# Patient Record
Sex: Male | Born: 1945 | Race: Black or African American | Hispanic: No | Smoking: Never smoker
Health system: Southern US, Community
[De-identification: ages and names within clinical notes are randomized; demographics above are authoritative.]

## PROBLEM LIST (undated history)

## (undated) ENCOUNTER — Ambulatory Visit (HOSPITAL_COMMUNITY): Admission: EM | Payer: Medicare Other | Source: Home / Self Care

## (undated) DIAGNOSIS — I10 Essential (primary) hypertension: Secondary | ICD-10-CM

## (undated) DIAGNOSIS — E785 Hyperlipidemia, unspecified: Secondary | ICD-10-CM

## (undated) DIAGNOSIS — E119 Type 2 diabetes mellitus without complications: Secondary | ICD-10-CM

## (undated) DIAGNOSIS — H548 Legal blindness, as defined in USA: Secondary | ICD-10-CM

## (undated) DIAGNOSIS — H409 Unspecified glaucoma: Secondary | ICD-10-CM

## (undated) DIAGNOSIS — R55 Syncope and collapse: Secondary | ICD-10-CM

## (undated) HISTORY — PX: COLONOSCOPY: SHX5424

## (undated) HISTORY — PX: FRACTURE SURGERY: SHX138

## (undated) HISTORY — PX: ANKLE FRACTURE SURGERY: SHX122

## (undated) HISTORY — PX: EYE SURGERY: SHX253

---

## 2004-10-12 ENCOUNTER — Ambulatory Visit (HOSPITAL_COMMUNITY): Admission: RE | Admit: 2004-10-12 | Discharge: 2004-10-12 | Payer: Self-pay | Admitting: Ophthalmology

## 2004-12-18 ENCOUNTER — Ambulatory Visit (HOSPITAL_COMMUNITY): Admission: RE | Admit: 2004-12-18 | Discharge: 2004-12-18 | Payer: Self-pay | Admitting: Ophthalmology

## 2005-01-11 ENCOUNTER — Emergency Department (HOSPITAL_COMMUNITY): Admission: EM | Admit: 2005-01-11 | Discharge: 2005-01-11 | Payer: Self-pay | Admitting: Emergency Medicine

## 2005-01-22 ENCOUNTER — Ambulatory Visit: Payer: Self-pay | Admitting: Internal Medicine

## 2007-10-18 ENCOUNTER — Ambulatory Visit (HOSPITAL_COMMUNITY): Admission: EM | Admit: 2007-10-18 | Discharge: 2007-10-18 | Payer: Self-pay | Admitting: Emergency Medicine

## 2008-10-20 ENCOUNTER — Emergency Department (HOSPITAL_COMMUNITY): Admission: EM | Admit: 2008-10-20 | Discharge: 2008-10-20 | Payer: Self-pay | Admitting: Family Medicine

## 2008-10-20 ENCOUNTER — Emergency Department (HOSPITAL_COMMUNITY): Admission: EM | Admit: 2008-10-20 | Discharge: 2008-10-21 | Payer: Self-pay | Admitting: Emergency Medicine

## 2010-06-03 LAB — BASIC METABOLIC PANEL
BUN: 11 mg/dL (ref 6–23)
CO2: 27 mEq/L (ref 19–32)
Calcium: 8.7 mg/dL (ref 8.4–10.5)
Chloride: 103 mEq/L (ref 96–112)
Creatinine, Ser: 1.12 mg/dL (ref 0.4–1.5)
GFR calc Af Amer: >60 mL/min (ref >60)
GFR calc non Af Amer: >60 mL/min (ref >60)
Glucose, Bld: 149 mg/dL — ABNORMAL HIGH (ref 70–99)
Potassium: 3.7 mEq/L (ref 3.5–5.1)
Sodium: 135 mEq/L (ref 135–145)

## 2010-06-03 LAB — POCT I-STAT, CHEM 8
BUN: 12 mg/dL (ref 6–23)
Calcium, Ion: 1.15 mmol/L (ref 1.12–1.32)
Chloride: 104 mEq/L (ref 96–112)
Creatinine, Ser: 1 mg/dL (ref 0.4–1.5)
Glucose, Bld: 135 mg/dL — ABNORMAL HIGH (ref 70–99)
HCT: 48 % (ref 39.0–52.0)
Hemoglobin: 16.3 g/dL (ref 13.0–17.0)
Potassium: 3.8 mEq/L (ref 3.5–5.1)
Sodium: 136 mEq/L (ref 135–145)
TCO2: 23 mmol/L (ref 0–100)

## 2010-06-03 LAB — URINALYSIS, ROUTINE W REFLEX MICROSCOPIC
Bilirubin Urine: NEGATIVE
Glucose, UA: NEGATIVE mg/dL
Ketones, ur: NEGATIVE mg/dL
Leukocytes, UA: NEGATIVE
Nitrite: NEGATIVE
Protein, ur: NEGATIVE mg/dL
Specific Gravity, Urine: 1.01 (ref 1.005–1.030)
Urobilinogen, UA: 0.2 mg/dL (ref 0.0–1.0)
pH: 5 (ref 5.0–8.0)

## 2010-06-03 LAB — URINE MICROSCOPIC-ADD ON

## 2010-06-03 LAB — DIFFERENTIAL
Basophils Absolute: 0.1 10*3/uL (ref 0.0–0.1)
Basophils Relative: 1 % (ref 0–1)
Eosinophils Absolute: 0 10*3/uL (ref 0.0–0.7)
Eosinophils Relative: 0 % (ref 0–5)
Lymphocytes Relative: 18 % (ref 12–46)
Lymphs Abs: 1.4 10*3/uL (ref 0.7–4.0)
Monocytes Absolute: 0.6 10*3/uL (ref 0.1–1.0)
Monocytes Relative: 8 % (ref 3–12)
Neutro Abs: 5.6 10*3/uL (ref 1.7–7.7)
Neutrophils Relative %: 74 % (ref 43–77)

## 2010-06-03 LAB — CBC
HCT: 43.3 % (ref 39.0–52.0)
Hemoglobin: 15.1 g/dL (ref 13.0–17.0)
MCHC: 34.8 g/dL (ref 30.0–36.0)
MCV: 90.3 fL (ref 78.0–100.0)
Platelets: 223 10*3/uL (ref 150–400)
RBC: 4.8 MIL/uL (ref 4.22–5.81)
RDW: 13.8 % (ref 11.5–15.5)
WBC: 7.7 10*3/uL (ref 4.0–10.5)
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
## 2010-07-14 NOTE — Op Note (Signed)
[name] [name], APPLEGATE                  ACCOUNT NO.:  [id number]   MEDICAL RECORD NO.:  [id number]          PATIENT TYPE:  AMB   LOCATION:  SDS                          FACILITY:  [facility]   PHYSICIAN:  Salley Scarlet., M.D.DATE OF BIRTH:  [birth year]   DATE OF PROCEDURE:  DATE OF DISCHARGE:  12/18/2004                                 OPERATIVE REPORT   PREOPERATIVE DIAGNOSIS:  Advanced glaucoma, poor control, right eye.   POSTOPERATIVE DIAGNOSIS:  Advanced glaucoma, poor control, right eye.   OPERATION/PROCEDURE:  Trabeculectomy, right eye, with mitomycin C.   ANESTHESIA:  Local using Xylocaine 2%, Marcaine 0.75% and Wydase.   JUSTIFICATION FOR PROCEDURE:  This is a 65 year old gentleman who has  history of advanced glaucoma, poorly controlled in both eyes.  He recently  underwent a trabeculectomy of the left eye with good surgical results, and  he is now admitted for trabeculectomy of the right eye.  He has intraocular  pressure of 26 on the right from the maximum medical therapy; 16 on the left  followed his glaucoma surgery.  Visual acuity 20/400 on the right and 20/60  on the left with markedly constricted visual fields.   DESCRIPTION OF PROCEDURE:  Under the influence of IV sedation, Van Lint  akinesia and retrobulbar anesthesia were given.  The patient was prepped and  draped in the usual manner.  The lid speculum was inserted under the upper  and lower lids of the right eye and a 4-0 silk traction suture was fastened  to the belly of the superior rectus muscle for traction.  A limbal-based  conjunctival flap was turned and hemostasis was achieved by using cautery.  A triangular scleral flap was fashioned in the superior and nasal quadrants  by using a beaver blade.  After fashioning the flap, a Weck-cel pledget  soaked in mitomycin C was allowed to sit on the sclera beneath the  conjunctiva and Tenon's capsule for approximately two minutes.  The  mitomycin C was thoroughly  irrigated from the eye.  The scleral flap was  then dissected down into clear cornea using the Crescent blade.  A side port  incision made at the 1:30 o'clock position.  Ocucoat was injected into the  eye through the side port incision.  Then beneath the triangular scleral  flap, a rectangular flap of tissue being approximately 2 x 3 mm in  dimensions was excised, thereby entering the anterior chamber and removing a  portion of the trabeculum.  A peripheral iridectomy was made in the eye.  Anterior chamber was reformed and the pupils constricted using Miochol.  Ocucoat was injected into the eye.  Two apices of the triangular scleral  flap was tacked down using 10-0 nylon.  The conjunctiva and Tenon's capsule  were closed by using a running suture of 8-0 Vicryl.  After that, seeing  that the wound was adequately closed, 1 mL of Celestone and 0.5 mL  gentamicin were injected subconjunctivally.  Maxitrol ophthalmic ointment  and Atropine drops were applied along with a patch and Fox shield.  The  patient tolerated  the procedure well and was discharged to the post  anesthesia care unit room in satisfactory condition.   He was instructed to rest today, to take Vicodin every four hours as needed  for pain and see me in the office tomorrow for further evaluation.   DISCHARGE DIAGNOSIS:  Advanced glaucoma, poorly controlled, right eye.      Salley Scarlet., M.D.  Electronically Signed     TB/MEDQ  D:  01/01/2005  T:  01/01/2005  Job:  [id number]

## 2011-12-28 ENCOUNTER — Encounter (HOSPITAL_COMMUNITY): Payer: Self-pay | Admitting: *Deleted

## 2011-12-28 ENCOUNTER — Encounter (HOSPITAL_COMMUNITY): Payer: Self-pay | Admitting: Pharmacy Technician

## 2011-12-28 ENCOUNTER — Other Ambulatory Visit: Payer: Self-pay | Admitting: Ophthalmology

## 2011-12-28 MED ORDER — TETRACAINE HCL 0.5 % OP SOLN: 1.0000 [drp] | OPHTHALMIC | Status: AC

## 2011-12-28 NOTE — Progress Notes (Signed)
Pt has medications for diabetes and high blood pressure, pt cant see to read labels.  [name] [name] , pts daughter is to call [facility] am of surgery with list of medications to see if patient should take any.

## 2011-12-28 NOTE — Progress Notes (Signed)
I notified the office that Dr Harlon Flor needs to sign orders.

## 2011-12-28 NOTE — H&P (Signed)
                  History & Physical:   DATE:     NAME:  [name] [name], [name] [name]      [id number]       HISTORY OF PRESENT ILLNESS: Chief Eye Complaints glaucoma    HPI: EYES: Reports symptoms of eyelid problems.        LOCATION:      QUALITY/COURSE:   Reports condition is worsening.        INTENSITY/SEVERITY:    Reports measurement ( or degree) as   DURATION:   Reports the general length of symptoms to be months.      ONSET/TIMING:   Reports occurrence as prolonged.      CONTEXT/WHEN:   Reports usually associated with   MODIFIERS/TREATMENTS:  Improved by              ROS:   GEN- Constitutional: HENT: GEN - Endocrine: Reports symptoms of diabetes.    LUNGS/Respiratory:  HEART/Cardiovascular: Reports symptoms of hypertension.    ABD/Gastrointestinal:   Musculoskeletal (BJE): NEURO/Neurological: PSYCH/Psychiatric:    Is the pt oriented to time, place, person? yes Mood depressed __ normal  agitated __  ACTIVE PROBLEMS: Glaucoma, severe stage   ICD#365.73  uncontrolled OS despite MTMT with failure of previous glaucoma surgery Elevated IOP OD Blindness, one eye, low vision other eye   ICD#369.10     Diabetic retinopathy, proliferative   ICD#362.02  SURGERIES: Pick List - Surgeries NONE  MEDICATIONS: Diamox Sequels: Strength-  SIG-   last used 4 days ago Prednisolone acetate inj  #[id number]  Related Dxs-  OD QD   Dorzolamide Hydrochloride: Strength-  SIG-  Dose-  Freq-       Modifiers-      Alphagan P: Strength-  SIG-    Travatan Z: Strength-  SIG-   OU QHS  No exposure to tobacco.  SOCIAL HISTORY: Single. WORKS  [employer]  FAMILY HISTORY: Positive family history for  -   Negative family history for  -   PARENTS: CHILDREN: GRANDPARENTS: SIBLINGS: UNCLES/AUNTS: OTHERS/DISTANT:  ALLERGIES: Starter - Allergies - Summary:  PHYSICAL EXAMINATION: [place]     OD:cc 20/NLP OS:cc 20/70 PH 20/NI  EYEGLASSES:  OD:-2.25+0.50x162                                               OS:-2.25+0.50x017 Single Vision  MR:Defer    PUPILS:  OD:nonreactive  OS:4mm   EYELIDS & OCULAR ADNEXA: normal Mortality: Full  SLE: Conjunctiva: +1 injection with cystic bleb OS  Cornea:   Decreased Tear Break-Up Time with Arcus OU  edema OD w/ endothelia pigment    Anterior Chamber:   deep and quiet  OU  Iris: brown OD:Brombay OS:  PERIPHERAL  IRIDECTOMY   close Lens:  OD:1+NS OS:1-2 NS  Ta   in mmHg: Last Visit OD44/44/OS 22    OD: 60            OS: 26/ 22 after DP by Patient Time:12/27/2011 17:10   Gonio:show area close  Fundus:  optic nerve:   OD: 90% cup                                                     OS:Temporal Rim loss   Macula:  Clear     OD:                                                     OS:   Vessels:  Periphery:  H&P B/P:160/88 Pulse:68 Resp:20   Exam: GENERAL: Appearance: General appearance can be described as well-nourished, well-developed, and in no acute distress.    LYMPHATIC: HEAD, EARS, NOSE AND THROAT: Ears-Nose (external) Inspection: Externally, nose and ears are normal in appearance and without scars, lesions, or nodules.      Otoscopic Exam: External auditory canals and tympanic membranes are normal.      Hearing assessment shows no problems with normal conversation.    Nose exam, internally, reveals nasal mucosa, septum and turbinates are unremarkable.    Teeth, Gingiva, and Lip Exams: No lesions or evidence of infection.      Oropharynx demonstrates oral mucosa, salivary glands, tongue, tonsils, posterior pharynx, hard-soft palates are normal.  EYES: see above  NECK: Neck tissue exam demonstrates no masses, symmetrical, and trachea is midline.      LUNGS and RESPIRATORY: Lung auscultation elicits no wheezing, rhonci, rales or rubs and with equal breath sounds.    Respiratory effort described as breathing is unlabored and chest movement is symmetrical.    HEART (Cardiovascular): Heart auscultation discovers  regular rate and rhythm; no murmur, gallop or rub. Normal heart sounds.    ABDOMEN (Gastrointestinal): Mass/Tenderness Exam: Neither are present.     Liver/Spleen: No hepatomegaly or splenomegaly.   MUSCULOSKELETAL (BJE): Inspection-Palpation: No major bone, joint, tendon, or muscle changes.      NEUROLOGICAL: Alert and oriented. No major deficits of coordination or sensation.      PSYCHIATRIC: Insight and judgment appear  both to be intact and appropriate.    Mood and affect are described as normal mood and full affect.    SKIN: Skin Inspection: No rashes or lesions.  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  Given 500 mg Diamox      ___________________________ Derric Dealmeida, Jr. Starter - Inactive Problems:  

## 2011-12-30 MED ORDER — GATIFLOXACIN 0.5 % OP SOLN
1.0000 [drp] | OPHTHALMIC | Status: AC
  Administered 2011-12-31 (×3): 1 [drp] via OPHTHALMIC

## 2011-12-31 ENCOUNTER — Encounter (HOSPITAL_COMMUNITY): Payer: Self-pay | Admitting: Surgery

## 2011-12-31 ENCOUNTER — Ambulatory Visit (HOSPITAL_COMMUNITY)
Admission: RE | Admit: 2011-12-31 | Discharge: 2011-12-31 | Disposition: A | Discharge status: Home / Self Care | Payer: Medicare Other | Source: Ambulatory Visit | Attending: Ophthalmology | Admitting: Ophthalmology

## 2011-12-31 ENCOUNTER — Encounter (HOSPITAL_COMMUNITY)
Admission: RE | Disposition: A | Discharge status: Home / Self Care | Payer: Self-pay | Source: Ambulatory Visit | Attending: Ophthalmology

## 2011-12-31 ENCOUNTER — Encounter (HOSPITAL_COMMUNITY): Payer: Self-pay | Admitting: Certified Registered Nurse Anesthetist

## 2011-12-31 ENCOUNTER — Ambulatory Visit: Admit: 2011-12-31 | Payer: Self-pay | Admitting: Ophthalmology

## 2011-12-31 ENCOUNTER — Ambulatory Visit (HOSPITAL_COMMUNITY): Payer: Medicare Other | Admitting: Certified Registered Nurse Anesthetist

## 2011-12-31 ENCOUNTER — Ambulatory Visit (HOSPITAL_COMMUNITY): Payer: Medicare Other

## 2011-12-31 DIAGNOSIS — Y849 Medical procedure, unspecified as the cause of abnormal reaction of the patient, or of later complication, without mention of misadventure at the time of the procedure: Secondary | ICD-10-CM | POA: Insufficient documentation

## 2011-12-31 DIAGNOSIS — Y9210 Unspecified residential institution as the place of occurrence of the external cause: Secondary | ICD-10-CM | POA: Insufficient documentation

## 2011-12-31 DIAGNOSIS — E119 Type 2 diabetes mellitus without complications: Secondary | ICD-10-CM | POA: Insufficient documentation

## 2011-12-31 DIAGNOSIS — IMO0002 Reserved for concepts with insufficient information to code with codable children: Secondary | ICD-10-CM | POA: Insufficient documentation

## 2011-12-31 DIAGNOSIS — I10 Essential (primary) hypertension: Secondary | ICD-10-CM | POA: Insufficient documentation

## 2011-12-31 DIAGNOSIS — H409 Unspecified glaucoma: Secondary | ICD-10-CM | POA: Insufficient documentation

## 2011-12-31 HISTORY — PX: TRABECULECTOMY: SHX107

## 2011-12-31 HISTORY — DX: Essential (primary) hypertension: I10

## 2011-12-31 LAB — BASIC METABOLIC PANEL
BUN: 19 mg/dL (ref 6–23)
CO2: 24 mEq/L (ref 19–32)
Calcium: 9.4 mg/dL (ref 8.4–10.5)
Chloride: 106 mEq/L (ref 96–112)
Creatinine, Ser: 1.09 mg/dL (ref 0.50–1.35)
GFR calc non Af Amer: 70 mL/min — ABNORMAL LOW (ref >90)
Glucose, Bld: 122 mg/dL — ABNORMAL HIGH (ref 70–99)
Potassium: 4.1 mEq/L (ref 3.5–5.1)
Sodium: 139 mEq/L (ref 135–145)

## 2011-12-31 LAB — CBC
HCT: 45.1 % (ref 39.0–52.0)
Hemoglobin: 15.7 g/dL (ref 13.0–17.0)
MCH: 30.3 pg (ref 26.0–34.0)
MCV: 86.9 fL (ref 78.0–100.0)
RBC: 5.19 MIL/uL (ref 4.22–5.81)
RDW: 13.5 % (ref 11.5–15.5)
WBC: 4.9 10*3/uL (ref 4.0–10.5)

## 2011-12-31 LAB — SURGICAL PCR SCREEN
MRSA, PCR: NEGATIVE
Staphylococcus aureus: NEGATIVE

## 2011-12-31 SURGERY — TRABECULECTOMY
Anesthesia: Monitor Anesthesia Care | Site: Eye | Laterality: Left | Wound class: Clean

## 2011-12-31 SURGERY — INSERTION OF MINI SHUNT
Anesthesia: Monitor Anesthesia Care | Laterality: Left

## 2011-12-31 MED ORDER — EPINEPHRINE HCL 1 MG/ML IJ SOLN
INTRAMUSCULAR | Status: AC
  Filled 2011-12-31: qty 1

## 2011-12-31 MED ORDER — LIDOCAINE HCL 2 % IJ SOLN
INTRAMUSCULAR | Status: DC | PRN
  Administered 2011-12-31: 10 mL

## 2011-12-31 MED ORDER — FLUOROURACIL FOR OPHTHALMIC USE
25.0000 mg | Freq: Once | INTRAVENOUS | Status: DC
  Filled 2011-12-31: qty 0.5

## 2011-12-31 MED ORDER — FLUOROURACIL FOR OPHTHALMIC USE
INTRAVENOUS | Status: DC | PRN
  Administered 2011-12-31: 5 mg via OPHTHALMIC

## 2011-12-31 MED ORDER — ATROPINE SULFATE 1 % OP OINT
TOPICAL_OINTMENT | OPHTHALMIC | Status: AC
  Filled 2011-12-31: qty 3.5

## 2011-12-31 MED ORDER — FLUORESCEIN SODIUM 1 MG OP STRP
ORAL_STRIP | OPHTHALMIC | Status: AC
  Filled 2011-12-31: qty 3

## 2011-12-31 MED ORDER — LIDOCAINE HCL 2 % IJ SOLN
INTRAMUSCULAR | Status: AC
  Filled 2011-12-31: qty 20

## 2011-12-31 MED ORDER — TOBRAMYCIN-DEXAMETHASONE 0.3-0.1 % OP OINT
TOPICAL_OINTMENT | OPHTHALMIC | Status: AC
  Filled 2011-12-31: qty 3.5

## 2011-12-31 MED ORDER — HYALURONIDASE HUMAN 150 UNIT/ML IJ SOLN
INTRAMUSCULAR | Status: AC
  Filled 2011-12-31: qty 1

## 2011-12-31 MED ORDER — BSS IO SOLN
INTRAOCULAR | Status: DC | PRN
  Administered 2011-12-31: 15 mL via INTRAOCULAR

## 2011-12-31 MED ORDER — PILOCARPINE HCL 4 % OP GEL
OPHTHALMIC | Status: AC
  Filled 2011-12-31: qty 4

## 2011-12-31 MED ORDER — MUPIROCIN 2 % EX OINT
TOPICAL_OINTMENT | CUTANEOUS | Status: AC
  Administered 2011-12-31: 1 via NASAL
  Filled 2011-12-31: qty 22

## 2011-12-31 MED ORDER — TETRACAINE HCL 0.5 % OP SOLN
OPHTHALMIC | Status: DC | PRN
  Administered 2011-12-31: 2 [drp] via OPHTHALMIC

## 2011-12-31 MED ORDER — ACETYLCHOLINE CHLORIDE 1:100 IO SOLR
INTRAOCULAR | Status: AC
  Filled 2011-12-31: qty 1

## 2011-12-31 MED ORDER — BSS IO SOLN
INTRAOCULAR | Status: AC
  Filled 2011-12-31: qty 500

## 2011-12-31 MED ORDER — LIDOCAINE-EPINEPHRINE 2 %-1:100000 IJ SOLN
INTRAMUSCULAR | Status: AC
  Filled 2011-12-31: qty 1

## 2011-12-31 MED ORDER — SODIUM CHLORIDE 0.9 % IV SOLN
INTRAVENOUS | Status: DC
  Administered 2011-12-31 (×2): via INTRAVENOUS

## 2011-12-31 MED ORDER — TRIAMCINOLONE ACETONIDE 40 MG/ML IJ SUSP
INTRAMUSCULAR | Status: AC
  Filled 2011-12-31: qty 1

## 2011-12-31 MED ORDER — SODIUM HYALURONATE 10 MG/ML IO SOLN
INTRAOCULAR | Status: AC
  Filled 2011-12-31: qty 0.85

## 2011-12-31 MED ORDER — BUPIVACAINE HCL (PF) 0.25 % IJ SOLN
INTRAMUSCULAR | Status: AC
  Filled 2011-12-31: qty 30

## 2011-12-31 MED ORDER — MUPIROCIN 2 % EX OINT
TOPICAL_OINTMENT | Freq: Two times a day (BID) | CUTANEOUS | Status: DC
  Filled 2011-12-31: qty 22

## 2011-12-31 MED ORDER — BSS IO SOLN
INTRAOCULAR | Status: AC
  Filled 2011-12-31: qty 15

## 2011-12-31 MED ORDER — TOBRAMYCIN-DEXAMETHASONE 0.3-0.1 % OP SUSP
OPHTHALMIC | Status: DC | PRN
  Administered 2011-12-31: 2 [drp] via OPHTHALMIC

## 2011-12-31 MED ORDER — TETRACAINE HCL 0.5 % OP SOLN
OPHTHALMIC | Status: AC
  Filled 2011-12-31: qty 2

## 2011-12-31 MED ORDER — GATIFLOXACIN 0.5 % OP SOLN
OPHTHALMIC | Status: AC
  Administered 2011-12-31: 1 [drp] via OPHTHALMIC
  Filled 2011-12-31: qty 2.5

## 2011-12-31 SURGICAL SUPPLY — 39 items
APPLICATOR COTTON TIP 6IN STRL (MISCELLANEOUS) IMPLANT
APPLICATOR DR MATTHEWS STRL (MISCELLANEOUS) ×2 IMPLANT
BLADE EYE CATARACT 19 1.4 BEAV (BLADE) IMPLANT
BLADE MINI RND TIP GREEN BEAV (BLADE) IMPLANT
BLADE STAB KNIFE 45DEG (BLADE) IMPLANT
CANISTER SUCTION 2500CC (MISCELLANEOUS) IMPLANT
CLOTH BEACON ORANGE TIMEOUT ST (SAFETY) ×2 IMPLANT
CORDS BIPOLAR (ELECTRODE) IMPLANT
DRAPE OPHTHALMIC 40X48 W POUCH (DRAPES) ×2 IMPLANT
DRAPE RETRACTOR (MISCELLANEOUS) ×2 IMPLANT
ERASER HMR WETFIELD 23G BP (MISCELLANEOUS) IMPLANT
GLOVE BIO SURGEON STRL SZ8 (GLOVE) ×2 IMPLANT
GLOVE BIO SURGEON STRL SZ8.5 (GLOVE) ×2 IMPLANT
GLOVE ECLIPSE 7.0 STRL STRAW (GLOVE) ×2 IMPLANT
GOWN STRL NON-REIN LRG LVL3 (GOWN DISPOSABLE) ×4 IMPLANT
KIT BASIN OR (CUSTOM PROCEDURE TRAY) ×2 IMPLANT
KIT ROOM TURNOVER OR (KITS) ×2 IMPLANT
KNIFE GRIESHABER SHARP 2.5MM (MISCELLANEOUS) IMPLANT
MARKER SKIN DUAL TIP RULER LAB (MISCELLANEOUS) ×2 IMPLANT
MASK EYE SHIELD (GAUZE/BANDAGES/DRESSINGS) ×2 IMPLANT
NEEDLE 25GX 5/8IN NON SAFETY (NEEDLE) ×2 IMPLANT
NEEDLE HYPO 30X.5 LL (NEEDLE) ×2 IMPLANT
NS IRRIG 1000ML POUR BTL (IV SOLUTION) ×2 IMPLANT
PACK CATARACT CUSTOM (CUSTOM PROCEDURE TRAY) ×2 IMPLANT
PAD ARMBOARD 7.5X6 YLW CONV (MISCELLANEOUS) ×4 IMPLANT
PAD EYE OVAL STERILE LF (GAUZE/BANDAGES/DRESSINGS) ×2 IMPLANT
SPEAR EYE SURG WECK-CEL (MISCELLANEOUS) IMPLANT
SPECIMEN JAR SMALL (MISCELLANEOUS) IMPLANT
SPONGE SURGIFOAM ABS GEL 12-7 (HEMOSTASIS) IMPLANT
SUT ETHILON 10 0 CS140 6 (SUTURE) IMPLANT
SUT ETHILON 9 0 BV100 4 (SUTURE) IMPLANT
SUT SILK 6 0 G 6 (SUTURE) ×2 IMPLANT
SUT VICRYL 9-0 (SUTURE) IMPLANT
SYR 50ML SLIP (SYRINGE) ×2 IMPLANT
SYR TB 1ML LUER SLIP (SYRINGE) IMPLANT
TOWEL OR 17X24 6PK STRL BLUE (TOWEL DISPOSABLE) ×4 IMPLANT
TUBE CONNECTING 12X1/4 (SUCTIONS) IMPLANT
WATER STERILE IRR 1000ML POUR (IV SOLUTION) ×2 IMPLANT
WIPE INSTRUMENT VISIWIPE 73X73 (MISCELLANEOUS) ×2 IMPLANT

## 2011-12-31 NOTE — Anesthesia Preprocedure Evaluation (Addendum)
Anesthesia Evaluation  Patient identified by MRN, date of birth, ID band Patient awake    Reviewed: Allergy & Precautions, H&P , NPO status , Patient's Chart, lab work & pertinent test results  Airway Mallampati: I TM Distance: >3 FB Neck ROM: Full    Dental  (+) Dental Advisory Given, Poor Dentition and Loose   Pulmonary          Cardiovascular hypertension, Pt. on medications Rhythm:regular Rate:Normal     Neuro/Psych    GI/Hepatic   Endo/Other  diabetes, Well Controlled, Type 2, Oral Hypoglycemic Agents  Renal/GU      Musculoskeletal   Abdominal   Peds  Hematology   Anesthesia Other Findings   Reproductive/Obstetrics                          Anesthesia Physical Anesthesia Plan  ASA: II  Anesthesia Plan: MAC   Post-op Pain Management:    Induction: Intravenous  Airway Management Planned: Nasal Cannula  Additional Equipment:   Intra-op Plan:   Post-operative Plan: Extubation in OR  Informed Consent: I have reviewed the patients History and Physical, chart, labs and discussed the procedure including the risks, benefits and alternatives for the proposed anesthesia with the patient or authorized representative who has indicated his/her understanding and acceptance.   Dental advisory given  Plan Discussed with: CRNA, Anesthesiologist and Surgeon  Anesthesia Plan Comments: (Plan for topical anesthetic drops and no additional anesthesia )     Anesthesia Quick Evaluation

## 2011-12-31 NOTE — Op Note (Signed)
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 Postoperative diagnosis: Same Procedure: Revision of operative wound left eye using 5-fluorouracil Complications: None Anesthesia topical 2% Xylocaine and tetracaine Procedure: The patient was taken to the operating room where his face was prepped and draped in the usual sterile fashion with a surgeon sitting superiorly and the operating microscope in position a 6-0 silk suture was passed through clear cornea to infraducted the eye with diet infraducted positioned it was noted that there was an elevated bleb at the 12:00 position and the corneal scleral limbus and cystic bleb with suture visible through the scleral flap therefore a 30-gauge needle on a balanced salt solution was passed posterior to the cystic bleb under the scleral flap until the needle was seen entering the anterior chamber with iris on the end of the needle. The needle was moved left and right to these the iris and guaranteeing communication between the anterior chamber and a subconjunctival space. The needle was withdrawn from the eye topical fluorescein was applied to the eye no leakage was noted a paracentesis was performed at the 3:00 position balanced salt solution was injected in the eye using a blunt cannula. Again fluorescein was applied to the incision and a very slow Seidel positive leak was noted. The intraocular pressure was measured with this the Schiotz tonometer pressure was noted to be 5 on the Schiotz scale between 5 and 10. The intraocular pressure being adequately low was fine therefore 5-fluorouracil 5 mg was injected adjacent to the bleb in the superior temporal quadrant again the surgical area was checked for leakage and no additional leakage was noted the bleb appeared to be more diffuse at this time and with moderate indentation of the cornea actually with mild indentation of the cornea the cornea indented indicating a  low intraocular pressure. At this point the procedure was ended the lid speculum was removed topical TobraDex ointment was applied to the eye and a Fox shield was placed over the eye allowing the patient to see through the holes because this was his only seeing eye the other eye is no light perception.  Surgeon: Rory Joeph Szatkowski Junior M.D. 

## 2011-12-31 NOTE — Anesthesia Postprocedure Evaluation (Signed)
  Anesthesia Post-op Note  Patient: [name] [name]  Procedure(s) Performed: Procedure(s) (LRB) with comments: TRABECULECTOMY (Left) - REVISED OPERATIVE WOUND POST GLAUCOMA SURGERY  Patient Location: PACU  Anesthesia Type:MAC  Level of Consciousness: awake, alert , oriented and patient cooperative  Airway and Oxygen Therapy: Patient Spontanous Breathing  Post-op Pain: mild  Post-op Assessment: Post-op Vital signs reviewed, Patient's Cardiovascular Status Stable, Respiratory Function Stable, Patent Airway, No signs of Nausea or vomiting and Pain level controlled  Post-op Vital Signs: stable  Complications: No apparent anesthesia complications

## 2011-12-31 NOTE — Op Note (Signed)
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 Postoperative diagnosis: Same Procedure: Revision of operative wound left eye using 5-fluorouracil Complications: None Anesthesia topical 2% Xylocaine and tetracaine Procedure: The patient was taken to the operating room where his face was prepped and draped in the usual sterile fashion with a surgeon sitting superiorly and the operating microscope in position a 6-0 silk suture was passed through clear cornea to infraducted the eye with diet infraducted positioned it was noted that there was an elevated bleb at the 12:00 position and the corneal scleral limbus and cystic bleb with suture visible through the scleral flap therefore a 30-gauge needle on a balanced salt solution was passed posterior to the cystic bleb under the scleral flap until the needle was seen entering the anterior chamber with iris on the end of the needle. The needle was moved left and right to these the iris and guaranteeing communication between the anterior chamber and a subconjunctival space. The needle was withdrawn from the eye topical fluorescein was applied to the eye no leakage was noted a paracentesis was performed at the 3:00 position balanced salt solution was injected in the eye using a blunt cannula. Again fluorescein was applied to the incision and a very slow Seidel positive leak was noted. The intraocular pressure was measured with this the Schiotz tonometer pressure was noted to be 5 on the Schiotz scale between 5 and 10. The intraocular pressure being adequately low was fine therefore 5-fluorouracil 5 mg was injected adjacent to the bleb in the superior temporal quadrant again the surgical area was checked for leakage and no additional leakage was noted the bleb appeared to be more diffuse at this time and with moderate indentation of the cornea actually with mild indentation of the cornea the cornea indented indicating a  low intraocular pressure. At this point the procedure was ended the lid speculum was removed topical TobraDex ointment was applied to the eye and a Fox shield was placed over the eye allowing the patient to see through the holes because this was his only seeing eye the other eye is no light perception.  Surgeon: Ardelle Anton Junior M.D.

## 2011-12-31 NOTE — Op Note (Signed)
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 Postoperative diagnosis: Same Procedure: Revision of operative wound left eye using 5-fluorouracil Complications: None Anesthesia topical 2% Xylocaine and tetracaine Procedure: The patient was taken to the operating room where his face was prepped and draped in the usual sterile fashion with a surgeon sitting superiorly and the operating microscope in position a 6-0 silk suture was passed through clear cornea to infraducted the eye with diet infraducted positioned it was noted that there was an elevated bleb at the 12:00 position and the corneal scleral limbus and cystic bleb with suture visible through the scleral flap therefore a 30-gauge needle on a balanced salt solution was passed posterior to the cystic bleb under the scleral flap until the needle was seen entering the anterior chamber with iris on the end of the needle. The needle was moved left and right to these the iris and guaranteeing communication between the anterior chamber and a subconjunctival space. The needle was withdrawn from the eye topical fluorescein was applied to the eye no leakage was noted a paracentesis was performed at the 3:00 position balanced salt solution was injected in the eye using a blunt cannula. Again fluorescein was applied to the incision and a very slow Seidel positive leak was noted. The intraocular pressure was measured with this the Schiotz tonometer pressure was noted to be 5 on the Schiotz scale between 5 and 10. The intraocular pressure being adequately low was fine therefore 5-fluorouracil 5 mg was injected adjacent to the bleb in the superior temporal quadrant again the surgical area was checked for leakage and no additional leakage was noted the bleb appeared to be more diffuse at this time and with moderate indentation of the cornea actually with mild indentation of the cornea the cornea indented indicating a  low intraocular pressure. At this point the procedure was ended the lid speculum was removed topical TobraDex ointment was applied to the eye and a Fox shield was placed over the eye allowing the patient to see through the holes because this was his only seeing eye the other eye is no light perception.  Surgeon: Rory Kayn Haymore Junior M.D. 

## 2011-12-31 NOTE — Preoperative (Signed)
Beta Blockers   Reason not to administer Beta Blockers:Not Applicable 

## 2011-12-31 NOTE — Op Note (Signed)
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 Postoperative diagnosis: Same Procedure: Revision of operative wound left eye using 5-fluorouracil Complications: None Anesthesia topical 2% Xylocaine and tetracaine Procedure: The patient was taken to the operating room where his face was prepped and draped in the usual sterile fashion with a surgeon sitting superiorly and the operating microscope in position a 6-0 silk suture was passed through clear cornea to infraducted the eye with diet infraducted positioned it was noted that there was an elevated bleb at the 12:00 position and the corneal scleral limbus and cystic bleb with suture visible through the scleral flap therefore a 30-gauge needle on a balanced salt solution was passed posterior to the cystic bleb under the scleral flap until the needle was seen entering the anterior chamber with iris on the end of the needle. The needle was moved left and right to these the iris and guaranteeing communication between the anterior chamber and a subconjunctival space. The needle was withdrawn from the eye topical fluorescein was applied to the eye no leakage was noted a paracentesis was performed at the 3:00 position balanced salt solution was injected in the eye using a blunt cannula. Again fluorescein was applied to the incision and a very slow Seidel positive leak was noted. The intraocular pressure was measured with this the Schiotz tonometer pressure was noted to be 5 on the Schiotz scale between 5 and 10. The intraocular pressure being adequately low was fine therefore 5-fluorouracil 5 mg was injected adjacent to the bleb in the superior temporal quadrant again the surgical area was checked for leakage and no additional leakage was noted the bleb appeared to be more diffuse at this time and with moderate indentation of the cornea actually with mild indentation of the cornea the cornea indented indicating a  low intraocular pressure. At this point the procedure was ended the lid speculum was removed topical TobraDex ointment was applied to the eye and a Fox shield was placed over the eye allowing the patient to see through the holes because this was his only seeing eye the other eye is no light perception.  Surgeon: Rory Jaxtyn Linville Junior M.D. 

## 2011-12-31 NOTE — Op Note (Signed)
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 Postoperative diagnosis: Same Procedure: Revision of operative wound left eye using 5-fluorouracil Complications: None Anesthesia topical 2% Xylocaine and tetracaine Procedure: The patient was taken to the operating room where his face was prepped and draped in the usual sterile fashion with a surgeon sitting superiorly and the operating microscope in position a 6-0 silk suture was passed through clear cornea to infraducted the eye with diet infraducted positioned it was noted that there was an elevated bleb at the 12:00 position and the corneal scleral limbus and cystic bleb with suture visible through the scleral flap therefore a 30-gauge needle on a balanced salt solution was passed posterior to the cystic bleb under the scleral flap until the needle was seen entering the anterior chamber with iris on the end of the needle. The needle was moved left and right to these the iris and guaranteeing communication between the anterior chamber and a subconjunctival space. The needle was withdrawn from the eye topical fluorescein was applied to the eye no leakage was noted a paracentesis was performed at the 3:00 position balanced salt solution was injected in the eye using a blunt cannula. Again fluorescein was applied to the incision and a very slow Seidel positive leak was noted. The intraocular pressure was measured with this the Schiotz tonometer pressure was noted to be 5 on the Schiotz scale between 5 and 10. The intraocular pressure being adequately low was fine therefore 5-fluorouracil 5 mg was injected adjacent to the bleb in the superior temporal quadrant again the surgical area was checked for leakage and no additional leakage was noted the bleb appeared to be more diffuse at this time and with moderate indentation of the cornea actually with mild indentation of the cornea the cornea indented indicating a  low intraocular pressure. At this point the procedure was ended the lid speculum was removed topical TobraDex ointment was applied to the eye and a Fox shield was placed over the eye allowing the patient to see through the holes because this was his only seeing eye the other eye is no light perception.  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 Postoperative diagnosis: Same Procedure: Revision of operative wound left eye using 5-fluorouracil Complications: None Anesthesia topical 2% Xylocaine and tetracaine Procedure: The patient was taken to the operating room where his face was prepped and draped in the usual sterile fashion with a surgeon sitting superiorly and the operating microscope in position a 6-0 silk suture was passed through clear cornea to infraducted the eye with diet infraducted positioned it was noted that there was an elevated bleb at the 12:00 position and the corneal scleral limbus and cystic bleb with suture visible through the scleral flap therefore a 30-gauge needle on a balanced salt solution was passed posterior to the cystic bleb under the scleral flap until the needle was seen entering the anterior chamber with iris on the end of the needle. The needle was moved left and right to these the iris and guaranteeing communication between the anterior chamber and a subconjunctival space. The needle was withdrawn from the eye topical fluorescein was applied to the eye no leakage was noted a paracentesis was performed at the 3:00 position balanced salt solution was injected in the eye using a blunt cannula. Again fluorescein was applied to the incision and a very slow Seidel positive leak was noted. The intraocular pressure was measured with  this the Schiotz tonometer pressure was noted to be 5 on the Schiotz scale between 5 and 10. The intraocular pressure being adequately low was fine therefore  5-fluorouracil 5 mg was injected adjacent to the bleb in the superior temporal quadrant again the surgical area was checked for leakage and no additional leakage was noted the bleb appeared to be more diffuse at this time and with moderate indentation of the cornea actually with mild indentation of the cornea the cornea indented indicating a low intraocular pressure. At this point the procedure was ended the lid speculum was removed topical TobraDex ointment was applied to the eye and a Fox shield was placed over the eye allowing the patient to see through the holes because this was his only seeing eye the other eye is no light perception.  Surgeon: Ardelle Anton Junior M.D.

## 2011-12-31 NOTE — Op Note (Signed)
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 Postoperative diagnosis: Same Procedure: Revision of operative wound left eye using 5-fluorouracil Complications: None Anesthesia topical 2% Xylocaine and tetracaine Procedure: The patient was taken to the operating room where his face was prepped and draped in the usual sterile fashion with a surgeon sitting superiorly and the operating microscope in position a 6-0 silk suture was passed through clear cornea to infraducted the eye with diet infraducted positioned it was noted that there was an elevated bleb at the 12:00 position and the corneal scleral limbus and cystic bleb with suture visible through the scleral flap therefore a 30-gauge needle on a balanced salt solution was passed posterior to the cystic bleb under the scleral flap until the needle was seen entering the anterior chamber with iris on the end of the needle. The needle was moved left and right to these the iris and guaranteeing communication between the anterior chamber and a subconjunctival space. The needle was withdrawn from the eye topical fluorescein was applied to the eye no leakage was noted a paracentesis was performed at the 3:00 position balanced salt solution was injected in the eye using a blunt cannula. Again fluorescein was applied to the incision and a very slow Seidel positive leak was noted. The intraocular pressure was measured with this the Schiotz tonometer pressure was noted to be 5 on the Schiotz scale between 5 and 10. The intraocular pressure being adequately low was fine therefore 5-fluorouracil 5 mg was injected adjacent to the bleb in the superior temporal quadrant again the surgical area was checked for leakage and no additional leakage was noted the bleb appeared to be more diffuse at this time and with moderate indentation of the cornea actually with mild indentation of the cornea the cornea indented indicating a  low intraocular pressure. At this point the procedure was ended the lid speculum was removed topical TobraDex ointment was applied to the eye and a Fox shield was placed over the eye allowing the patient to see through the holes because this was his only seeing eye the other eye is no light perception.  Surgeon: Chalmers Guest Junior M.D.

## 2011-12-31 NOTE — Progress Notes (Signed)
Nurse called Dr. Harlon Flor for clarification on consent form. Orders clarified per physician. Nurse also questioned Dr. Harlon Flor about Zymaxid being the only eye drop ordered, and Dr. Harlon Flor informed Nurse that that was the only eye drop needed.

## 2011-12-31 NOTE — Transfer of Care (Signed)
Immediate Anesthesia Transfer of Care Note  Patient: [name] [name]  Procedure(s) Performed: Procedure(s) (LRB) with comments: TRABECULECTOMY (Left) - REVISED OPERATIVE WOUND POST GLAUCOMA SURGERY  Patient Location: Short Stay  Anesthesia Type:MAC  Level of Consciousness: awake, alert , oriented and patient cooperative  Airway & Oxygen Therapy: Patient Spontanous Breathing  Post-op Assessment: Report given to Short Stay RN  Post vital signs: Reviewed and stable  Complications: No apparent anesthesia complications

## 2011-12-31 NOTE — H&P (View-Only) (Signed)
History & Physical:   DATE:     NAMEYvon, Deck      [id number]       HISTORY OF PRESENT ILLNESS: Chief Eye Complaints glaucoma    HPI: EYES: Reports symptoms of eyelid problems.        LOCATION:      QUALITY/COURSE:   Reports condition is worsening.        INTENSITY/SEVERITY:    Reports measurement ( or degree) as   DURATION:   Reports the general length of symptoms to be months.      ONSET/TIMING:   Reports occurrence as prolonged.      CONTEXT/WHEN:   Reports usually associated with   MODIFIERS/TREATMENTS:  Improved by              ROS:   GEN- Constitutional: HENT: GEN - Endocrine: Reports symptoms of diabetes.    LUNGS/Respiratory:  HEART/Cardiovascular: Reports symptoms of hypertension.    ABD/Gastrointestinal:   Musculoskeletal (BJE): NEURO/Neurological: PSYCH/Psychiatric:    Is the pt oriented to time, place, person? 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  SOCIAL HISTORY: Single. WORKS  [employer]  FAMILY HISTORY: Positive family history for  -   Negative family history for  -   PARENTS: CHILDREN: GRANDPARENTS: SIBLINGS: UNCLES/AUNTS: OTHERS/DISTANT:  ALLERGIES: Starter - Allergies - Summary:  PHYSICAL EXAMINATION: [place]     OD:cc 20/NLP OS:cc 20/70 PH 20/NI  EYEGLASSES:  OD:-2.25+0.50x162                                               OS:-2.25+0.50x017 Single Vision  QM:VHQIO    PUPILS:  NG:[id number]  OS:38mm   EYELIDS & OCULAR ADNEXA: normal Mortality: Full  SLE: Conjunctiva: +1 injection with cystic bleb OS  Cornea:   Decreased Tear Break-Up Time with Arcus OU  edema OD w/ endothelia pigment    Anterior Chamber:   deep and quiet  OU  Iris: brown WN:[id number] OS:  PERIPHERAL  IRIDECTOMY   close Lens:  OD:1+NS OS:1-2 NS  Ta   in mmHg: Last Visit OD44/44/OS 22    OD: 60            OS: 26/ 22 after DP by Patient Time:12/27/2011 17:10   Gonio:show area close  Fundus:  optic nerve:   OD: 90% cup  ZO:[id number] Rim loss   Macula:  Clear     OD:                                                     OS:   Vessels:  Periphery:  H&P B/P:160/88 Pulse:68 Resp:20   Exam: GENERAL: Appearance: General appearance can be described as well-nourished, well-developed, and in no acute distress.    LYMPHATIC: HEAD, EARS, NOSE AND THROAT: Ears-Nose (external) Inspection: Externally, nose and ears are normal in appearance and without scars, lesions, or nodules.      Otoscopic Exam: External auditory canals and tympanic membranes are normal.      Hearing assessment shows no problems with normal conversation.    Nose exam, internally, reveals nasal mucosa, septum and turbinates are unremarkable.    Teeth, Gingiva, and Lip Exams: No lesions or evidence of infection.      Oropharynx demonstrates oral mucosa, salivary glands, tongue, tonsils, posterior pharynx, hard-soft palates are normal.  EYES: see above  NECK: Neck tissue exam demonstrates no masses, symmetrical, and trachea is midline.      LUNGS and RESPIRATORY: Lung auscultation elicits no wheezing, rhonci, rales or rubs and with equal breath sounds.    Respiratory effort described as breathing is unlabored and chest movement is symmetrical.    HEART (Cardiovascular): Heart auscultation discovers  regular rate and rhythm; no murmur, gallop or rub. Normal heart sounds.    ABDOMEN (Gastrointestinal): Mass/Tenderness Exam: Neither are present.     Liver/Spleen: No hepatomegaly or splenomegaly.   MUSCULOSKELETAL (BJE): Inspection-Palpation: No major bone, joint, tendon, or muscle changes.      NEUROLOGICAL: Alert and oriented. No major deficits of coordination or sensation.      PSYCHIATRIC: Insight and judgment appear  both to be intact and appropriate.    Mood and affect are described as normal mood and full affect.    SKIN: Skin Inspection: No rashes or lesions.  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  Given 500 mg Diamox      ___________________________ Chalmers Guest, Montez Hageman Starter - Inactive Problems:

## 2011-12-31 NOTE — Anesthesia Postprocedure Evaluation (Signed)
  Anesthesia Post-op Note  Patient: [name] [name]  Procedure(s) Performed: Procedure(s) (LRB) with comments: TRABECULECTOMY (Left) - REVISED OPERATIVE WOUND POST GLAUCOMA SURGERY  Patient Location: Short Stay  Anesthesia Type:MAC  Level of Consciousness: awake, alert , oriented and patient cooperative  Airway and Oxygen Therapy: Patient Spontanous Breathing  Post-op Pain: none  Post-op Assessment: Post-op Vital signs reviewed, Patient's Cardiovascular Status Stable, Respiratory Function Stable, Patent Airway, No signs of Nausea or vomiting, Adequate PO intake and Pain level controlled  Post-op Vital Signs: Reviewed and stable  Complications: No apparent anesthesia complications

## 2011-12-31 NOTE — Interval H&P Note (Signed)
History and Physical Interval Note:  12/31/2011 12:50 PM  [name] [name]  has presented today for surgery, with the diagnosis of SEVERE GLAUCOMA STAGE  The various methods of treatment have been discussed with the patient and family. After consideration of risks, benefits and other options for treatment, the patient has consented to  Procedure(s) (LRB) with comments: TRABECULECTOMY (Left) - REVISED OPERATIVE WOUND POST GLAUCOMA SURGERY as a surgical intervention .  The patient's history has been reviewed, patient examined, no change in status, stable for surgery.  I have reviewed the patient's chart and labs.  Questions were answered to the patient's satisfaction.     [name]

## 2012-01-01 ENCOUNTER — Encounter (HOSPITAL_COMMUNITY): Payer: Self-pay | Admitting: Ophthalmology

## 2012-10-20 ENCOUNTER — Encounter (HOSPITAL_COMMUNITY): Payer: Self-pay | Admitting: Emergency Medicine

## 2012-10-20 ENCOUNTER — Emergency Department (HOSPITAL_COMMUNITY)
Admission: EM | Admit: 2012-10-20 | Discharge: 2012-10-20 | Disposition: A | Discharge status: Home / Self Care | Payer: Medicare Other | Source: Home / Self Care | Attending: Family Medicine | Admitting: Family Medicine

## 2012-10-20 DIAGNOSIS — E119 Type 2 diabetes mellitus without complications: Secondary | ICD-10-CM

## 2012-10-20 LAB — POCT I-STAT, CHEM 8
BUN: 18 mg/dL (ref 6–23)
Creatinine, Ser: 1.2 mg/dL (ref 0.50–1.35)
Glucose, Bld: 223 mg/dL — ABNORMAL HIGH (ref 70–99)
Hemoglobin: 15.6 g/dL (ref 13.0–17.0)
Potassium: 4 mEq/L (ref 3.5–5.1)
Sodium: 141 mEq/L (ref 135–145)

## 2012-10-20 MED ORDER — METFORMIN HCL 500 MG PO TABS: 500.0000 mg | ORAL_TABLET | Freq: Two times a day (BID) | ORAL | Status: DC

## 2012-10-20 NOTE — ED Notes (Signed)
Refill on Glucophage.

## 2012-10-20 NOTE — Discharge Instructions (Signed)
Take medication as prescribed and follow-up at clinic for regular diabetes care.

## 2012-10-20 NOTE — ED Provider Notes (Signed)
CSN: [id number]     Arrival date & time 10/20/12  1652 History   First MD Initiated Contact with Patient 10/20/12 1708     Chief Complaint  Patient presents with  . Medication Refill   (Consider location/radiation/quality/duration/timing/severity/associated sxs/prior Treatment) Patient is a 67 y.o. male presenting with hyperglycemia. The history is provided by the patient and a relative.  Hyperglycemia Blood sugar level PTA:  297 yest Progression:  Unchanged Diabetes status:  Controlled with oral medications Current diabetic therapy:  Has been on meds for approx 2 yrs, out of meds for about 9 mos. Time since last antidiabetic medication:  9 months Context: noncompliance     Past Medical History  Diagnosis Date  . Hypertension   . Diabetes mellitus without complication   . Glaucoma    Past Surgical History  Procedure Laterality Date  . Ankle fracture surgery      with pinning  . Eye surgery      Left Eye for Glaucoma  . Trabeculectomy  12/31/2011    Procedure: TRABECULECTOMY;  Surgeon: Chalmers Guest, MD;  Location: [facility] OR;  Service: Ophthalmology;  Laterality: Left;  REVISED OPERATIVE WOUND POST GLAUCOMA SURGERY   History reviewed. No pertinent family history. History  Substance Use Topics  . Smoking status: Never Smoker   . Smokeless tobacco: Not on file  . Alcohol Use: No    Review of Systems  Constitutional: Negative.     Allergies  Review of patient's allergies indicates no known allergies.  Home Medications   Current Outpatient Rx  Name  Route  Sig  Dispense  Refill  . lisinopril-hydrochlorothiazide (PRINZIDE,ZESTORETIC) 10-12.5 MG per tablet   Oral   Take 1 tablet by mouth daily.         . metFORMIN (GLUCOPHAGE) 500 MG tablet   Oral   Take 500 mg by mouth 2 (two) times daily with a meal.         . metFORMIN (GLUCOPHAGE) 500 MG tablet   Oral   Take 1 tablet (500 mg total) by mouth 2 (two) times daily with a meal.   60 tablet   1   . prednisoLONE  acetate (PRED FORTE) 1 % ophthalmic suspension   Right Eye   Place 1 drop into the right eye 2 (two) times daily.          BP 162/107  Pulse 94  Temp(Src) 98.4 F (36.9 C) (Oral)  Resp 18  SpO2 99% Physical Exam  Nursing note and vitals reviewed. Constitutional: He is oriented to person, place, and time. He appears well-developed and well-nourished.  Cardiovascular: Regular rhythm.   Pulmonary/Chest: Effort normal and breath sounds normal.  Musculoskeletal: He exhibits no edema.  Neurological: He is alert and oriented to person, place, and time.  Skin: Skin is warm and dry.    ED Course  Procedures (including critical care time) Labs Review Labs Reviewed  POCT I-STAT, CHEM 8 - Abnormal; Notable for the following:    Glucose, Bld 223 (*)    All other components within normal limits   Imaging Review No results found.  MDM   1. Diabetes mellitus       Linna Hoff, MD 10/20/12 [phone]

## 2013-01-02 ENCOUNTER — Other Ambulatory Visit: Payer: Self-pay | Admitting: Ophthalmology

## 2013-01-02 MED ORDER — TETRACAINE HCL 0.5 % OP SOLN: 1.0000 [drp] | OPHTHALMIC | Status: DC

## 2013-01-02 NOTE — H&P (Signed)
                  History & Physical:   DATE:   01-01-13  NAME:  [name], [name]      [id number]       HISTORY OF PRESENT ILLNESS: Chief Eye Complaints  Glaucoma  patient  Have some pain in OD  HPI: EYES: Reports symptoms of uncontrolled glaucoma in only seeing eye OS               ROS:   GEN- Constitutional: HENT: GEN - Endocrine: Reports symptoms of diabetes.    +++++      LUNGS/Respiratory:  HEART/Cardiovascular: Reports symptoms of hypertension.    +++++      ABD/Gastrointestinal:   Musculoskeletal (BJE): NEURO/Neurological: PSYCH/Psychiatric:    Is the pt oriented to time, place, person?  Mood depressed __ normal  agitated __            Physical Exam  ACTIVE PROBLEMS: Glaucoma, severe stage   ICD#365.73  Onset:   Initial Date:   uncontrolled OS despite MTMT with failure of previous glaucoma surgery  Visual Fields  diffuse loss OS  Elevated IOP OD Blindness, one eye, low vision other eye   ICD#369.10  Onset:   Initial Date:     Diabetic retinopathy, proliferative   ICD#362.02  Onset:   Initial Date:  SURGERIES: Revision glaucoma surgery  OS 12-31-2011    Pick List - Surgeries  MEDICATIONS: OD BALANCE            OS -2.00 +0.50 x 009 20/50-  Cosopt (Dorzolamide-Hydrochloride-Timolol Mal):    2.23%-0.68% solution    SIG-   1 milliliter(s)  drop     2 times a day             OU BID  Alphagan P: 0.1% solution SIG-  1 gtt in each affected eye 3 times a day for 30 days  6:30am last used TID OU  Travatan Z: 0.004% solution SIG-  1 gtt in each affected eye once a day (in the evening) for 30 days  OU QHS  No using gtt below  Cosopt: 2.23%-0.68% solution SIG-  1 gtt in each eye 2 times a day for 30 days  BID OU  Pred Forte: acetate 1% suspension  OD BI PRN for pain  REVIEW OF SYSTEMS: not found  TOBACCO: No exposure to tobacco.      Never smoker   ICD#V13.8  Tobacco use:     Tobacco cessation:  SOCIAL HISTORY: Single.   [employer]  FAMILY  HISTORY:   Family History - 1st Degree Relatives:  Mother dead.  ALLERGIES: Drug Allergies.  No Known.    PHYSICAL EXAMINATION: VS: BP: 200/116.  P: 89 /min.   VS: BMI: 31.3.  BP: 160/102.  H: 72.00 in.  P: 84 /min.  RR: 20 /min.  W: 230lbs 0oz.    Va   OD:20/NLP OS:20/60-  EYEGLASSES: OD:-2.00-0.075X022 OS:-1.25 SV LENE    MR  OD BALANCE            OS -2.00 +0.50 x 009 20/50-           Mortality: Full  SLE: Conjunctiva: +1-2 injection with cystic bleb OS -seidel w/extension to cornea  Cornea:   Decreased Tear Break-Up Time with Arcus OU, staining OD   edema OD w/ endothelia pigment ICD#V20.2  Onset:   Initial Date:    or defect  pupillary block OD      Anterior Chamber:  deep and quiet  O  Iris: brown  OD:Brombay  noted Rubeosis OS:  PERIPHERAL  IRIDECTOMY   OS Lens:  OD:1+  nuclear  sclerosis    OS:1-2  nuclear  sclerosis    Vitreous:  CCT:  Ta   in mmHg:    OD:   70      OS: 31 Time 01/01/2013 17:22   Gonio: OS shows most  areas closed   Dilation:  Fundus:  optic nerve:   OD: 90% cup                                                   OS:Temporal Rim loss   Macula:  Clear    OS OD:                                                     OS:   Vessels: Periphery: poor view  Exam:    GENERAL: Appearance: General appearance can be described as well-nourished, well-developed, and in no acute distress .   HEAD, EARS, NOSE AND THROAT:   Ears-Nose (external) Inspection: Externally, nose and ears are normal in appearance and without scars, lesions, or nodules.      Hearing assessment shows no problems with normal conversation.    Nose exam, internally, reveals nasal mucosa, septum and turbinates are unremarkable.    Teeth, Gingiva, and Lip Exams: No lesions or evidence of infection.      Oropharynx demonstrates oral mucosa, salivary glands, tongue, tonsils, posterior pharynx, hard-soft palates are normal.       NECK:   Neck tissue exam demonstrates no  masses, symmetrical, and trachea is midline.      Thyroid exam reveals no masses, enlargements or tenderness .     LUNGS and RESPIRATORY:   Lung auscultation elicits no wheezing, rhonci, rales or rubs and with equal breath sounds.    Respiratory effort described as breathing is unlabored and chest movement is symmetrical.    Chest Percussion demonstrates no dullness, or hyperresonance.    Chest palpation reveals   no tactile fremitus .     HEART (Cardiovascular):   Heart auscultation discovers regular rate and rhythm; no murmur, gallop or rub. Normal heart sounds.    Abdominal aorta exam reveals normal exam.    Carotid artery findings include   normal, symmetrical pulses and without bruits.    Femoral arteries reveal normal pulse amplitudes.    Edema-Varicosity Exam: No significant peripheral edema or venous varicosities.      Pedal pulses demonstrate normal amplitudes and equal bilaterally .     ABDOMEN (Gastrointestinal):   Mass/Tenderness Exam: Neither are present.     MUSCULOSKELETAL (BJE):   Inspection-Palpation: No major bone, joint, tendon, or muscle changes.       Range of Motion:   Joints move freely and without pain. Gait and station demonstrate standing and walking are stable and functional.    Digit and Nails: Fingers-fingernails and toes-toenails are unremarkable.      NEUROLOGICAL: Alert and oriented. No major deficits of coordination or sensation. Cranial Nerves: No defects involving cranial nerves II-XII.      Reflexes: No deep tendon reflex deficits,   Babinski is negative.      Sensation: No abnormalities with testing of distal extremities to light touch.             PSYCHIATRIC:   Orientation is   intact to person, place and time.    Insight and judgment appear    both to be intact and appropriate.    Mood and affect are described as   normal mood and full affect .     SKIN:   Skin Inspection: No rashes or lesions. Skin Palpation:   Normal turgor and without  induration or nodules. +++++  ADMITTING DIAGNOSIS: Glaucoma, severe stage   ICD#365.73  uncontrolled OS despite MTMT with failure of previous glaucoma surgery  Visual Fields  diffuse loss OS  Elevated IOP OD Blindness, one eye, low vision other eye   ICD#369.10    Diabetic retinopathy, proliferative   ICD#362.02  SURGICAL TREATMENT PLAN: Glaucoma surgery OS with [facility]   Actions:    .   Referral to general practitioner Performed Date/Time: 01/01/2013 17:26 Ordering Provider: Itay Mella, Jr. Related       Discuss with physician Zykeria Laguardia, Jr.  

## 2013-01-05 ENCOUNTER — Encounter (HOSPITAL_COMMUNITY): Payer: Self-pay | Admitting: Pharmacy Technician

## 2013-01-06 ENCOUNTER — Encounter (HOSPITAL_COMMUNITY)
Admission: RE | Admit: 2013-01-06 | Discharge: 2013-01-06 | Disposition: A | Discharge status: Home / Self Care | Payer: Medicare Other | Source: Ambulatory Visit | Attending: Anesthesiology | Admitting: Anesthesiology

## 2013-01-06 ENCOUNTER — Encounter (HOSPITAL_COMMUNITY): Payer: Self-pay

## 2013-01-06 ENCOUNTER — Encounter (HOSPITAL_COMMUNITY)
Admission: RE | Admit: 2013-01-06 | Discharge: 2013-01-06 | Disposition: A | Discharge status: Home / Self Care | Payer: Medicare Other | Source: Ambulatory Visit | Attending: Ophthalmology | Admitting: Ophthalmology

## 2013-01-06 LAB — BASIC METABOLIC PANEL
BUN: 16 mg/dL (ref 6–23)
CO2: 26 mEq/L (ref 19–32)
Chloride: 102 mEq/L (ref 96–112)
Creatinine, Ser: 1.14 mg/dL (ref 0.50–1.35)
GFR calc Af Amer: 76 mL/min — ABNORMAL LOW (ref >90)
Glucose, Bld: 107 mg/dL — ABNORMAL HIGH (ref 70–99)
Potassium: 3.8 mEq/L (ref 3.5–5.1)

## 2013-01-06 LAB — CBC
HCT: 40.3 % (ref 39.0–52.0)
Hemoglobin: 14 g/dL (ref 13.0–17.0)
MCH: 29.9 pg (ref 26.0–34.0)
MCHC: 34.7 g/dL (ref 30.0–36.0)
MCV: 86.1 fL (ref 78.0–100.0)
RDW: 13.5 % (ref 11.5–15.5)

## 2013-01-06 NOTE — Pre-Procedure Instructions (Signed)
[name] [name]  01/06/2013   Your procedure is scheduled on:  Wednesday, November 12th.   Report to [facility], Main Entrance/ Entrance "A".at 10:10AM.               Complimentary valet parking available at this entrance  Call this number if you have problems the morning of surgery: [phone]   Remember:   Do not eat food or drink liquids after midnight.   Take these medicines the morning of surgery with A SIP OF WATER: None.   Do not wear jewelry, make-up or nail polish.  Do not wear lotions, powders, or perfumes. You may wear deodorant.  Men may shave face and neck.  Do not bring valuables to the hospital.  [facility] is not responsible  for any belongings or valuables.               Contacts, dentures or bridgework may not be worn into surgery.  Leave suitcase in the car. After surgery it may be brought to your room.  For patients admitted to the hospital, discharge time is determined by your  treatment team.               Patients discharged the day of surgery will not be allowed to drive home.  Name and phone number of your driver: -   Special Instructions: Shower with CHG wash (Bactoshield) tonight and again in the am prior to arriving to hospital.   Please read over the following fact sheets that you were given: Pain Booklet and Surgical Site Infection Prevention

## 2013-01-07 ENCOUNTER — Encounter (HOSPITAL_COMMUNITY): Payer: Self-pay | Admitting: *Deleted

## 2013-01-07 ENCOUNTER — Encounter (HOSPITAL_COMMUNITY)
Admission: RE | Disposition: A | Discharge status: Home / Self Care | Payer: Self-pay | Source: Ambulatory Visit | Attending: Ophthalmology

## 2013-01-07 ENCOUNTER — Encounter (HOSPITAL_COMMUNITY): Payer: Medicare Other | Admitting: Anesthesiology

## 2013-01-07 ENCOUNTER — Ambulatory Visit (HOSPITAL_COMMUNITY): Payer: Medicare Other | Admitting: Anesthesiology

## 2013-01-07 ENCOUNTER — Ambulatory Visit (HOSPITAL_COMMUNITY)
Admission: RE | Admit: 2013-01-07 | Discharge: 2013-01-07 | Disposition: A | Discharge status: Home / Self Care | Payer: Medicare Other | Source: Ambulatory Visit | Attending: Ophthalmology | Admitting: Ophthalmology

## 2013-01-07 DIAGNOSIS — H547 Unspecified visual loss: Secondary | ICD-10-CM | POA: Insufficient documentation

## 2013-01-07 DIAGNOSIS — I10 Essential (primary) hypertension: Secondary | ICD-10-CM | POA: Insufficient documentation

## 2013-01-07 DIAGNOSIS — E11359 Type 2 diabetes mellitus with proliferative diabetic retinopathy without macular edema: Secondary | ICD-10-CM | POA: Insufficient documentation

## 2013-01-07 DIAGNOSIS — H409 Unspecified glaucoma: Secondary | ICD-10-CM | POA: Insufficient documentation

## 2013-01-07 DIAGNOSIS — E1139 Type 2 diabetes mellitus with other diabetic ophthalmic complication: Secondary | ICD-10-CM | POA: Insufficient documentation

## 2013-01-07 HISTORY — PX: TRABECULECTOMY: SHX107

## 2013-01-07 HISTORY — PX: MINI SHUNT INSERTION: SHX5337

## 2013-01-07 LAB — GLUCOSE, CAPILLARY: Glucose-Capillary: 121 mg/dL — ABNORMAL HIGH (ref 70–99)

## 2013-01-07 SURGERY — TRABECULECTOMY
Anesthesia: Monitor Anesthesia Care | Site: Eye | Laterality: Left | Wound class: Clean

## 2013-01-07 MED ORDER — BSS IO SOLN
INTRAOCULAR | Status: AC
  Filled 2013-01-07: qty 500

## 2013-01-07 MED ORDER — EPINEPHRINE HCL 1 MG/ML IJ SOLN
INTRAMUSCULAR | Status: AC
  Filled 2013-01-07: qty 1

## 2013-01-07 MED ORDER — TOBRAMYCIN-DEXAMETHASONE 0.3-0.1 % OP OINT
TOPICAL_OINTMENT | OPHTHALMIC | Status: AC
  Filled 2013-01-07: qty 3.5

## 2013-01-07 MED ORDER — MIDAZOLAM HCL 5 MG/5ML IJ SOLN
INTRAMUSCULAR | Status: DC | PRN
  Administered 2013-01-07 (×2): 1 mg via INTRAVENOUS

## 2013-01-07 MED ORDER — BSS IO SOLN
INTRAOCULAR | Status: DC | PRN
  Administered 2013-01-07: 500 mL via INTRAOCULAR

## 2013-01-07 MED ORDER — LIDOCAINE HCL (PF) 1 % IJ SOLN
INTRAMUSCULAR | Status: DC | PRN
  Administered 2013-01-07: 2 mL

## 2013-01-07 MED ORDER — SODIUM HYALURONATE 10 MG/ML IO SOLN
INTRAOCULAR | Status: AC
  Filled 2013-01-07: qty 0.85

## 2013-01-07 MED ORDER — SODIUM CHLORIDE 0.9 % IV SOLN
INTRAVENOUS | Status: DC | PRN
  Administered 2013-01-07: 12:00:00 via INTRAVENOUS

## 2013-01-07 MED ORDER — TRIAMCINOLONE ACETONIDE 40 MG/ML IJ SUSP
INTRAMUSCULAR | Status: DC | PRN
  Administered 2013-01-07: 4 mg

## 2013-01-07 MED ORDER — LIDOCAINE-EPINEPHRINE 2 %-1:100000 IJ SOLN
INTRAMUSCULAR | Status: AC
  Filled 2013-01-07: qty 1

## 2013-01-07 MED ORDER — FENTANYL CITRATE 0.05 MG/ML IJ SOLN
INTRAMUSCULAR | Status: DC | PRN
  Administered 2013-01-07 (×2): 50 ug via INTRAVENOUS

## 2013-01-07 MED ORDER — BSS IO SOLN
INTRAOCULAR | Status: AC
  Filled 2013-01-07: qty 15

## 2013-01-07 MED ORDER — ATROPINE SULFATE 1 % OP SOLN
OPHTHALMIC | Status: AC
  Filled 2013-01-07: qty 2

## 2013-01-07 MED ORDER — LIDOCAINE-EPINEPHRINE 2 %-1:100000 IJ SOLN
INTRAMUSCULAR | Status: DC | PRN
  Administered 2013-01-07: 5 mL

## 2013-01-07 MED ORDER — GATIFLOXACIN 0.5 % OP SOLN
1.0000 [drp] | OPHTHALMIC | Status: AC
  Administered 2013-01-07 (×3): 1 [drp] via OPHTHALMIC

## 2013-01-07 MED ORDER — ACETYLCHOLINE CHLORIDE 1:100 IO SOLR
INTRAOCULAR | Status: AC
  Filled 2013-01-07: qty 1

## 2013-01-07 MED ORDER — TOBRAMYCIN 0.3 % OP OINT
TOPICAL_OINTMENT | OPHTHALMIC | Status: DC | PRN
  Administered 2013-01-07: 1 via OPHTHALMIC

## 2013-01-07 MED ORDER — FLUORESCEIN SODIUM 1 MG OP STRP
ORAL_STRIP | OPHTHALMIC | Status: DC | PRN
  Administered 2013-01-07: 1 via OPHTHALMIC

## 2013-01-07 MED ORDER — BUPIVACAINE HCL (PF) 0.75 % IJ SOLN
INTRAMUSCULAR | Status: AC
  Filled 2013-01-07: qty 10

## 2013-01-07 MED ORDER — LIDOCAINE HCL 3.5 % OP GEL
OPHTHALMIC | Status: DC
  Filled 2013-01-07: qty 1

## 2013-01-07 MED ORDER — FLUORESCEIN SODIUM 1 MG OP STRP
ORAL_STRIP | OPHTHALMIC | Status: AC
  Filled 2013-01-07: qty 2

## 2013-01-07 MED ORDER — SODIUM CHLORIDE 0.9 % IV SOLN
INTRAVENOUS | Status: DC
  Administered 2013-01-07: 10:00:00 via INTRAVENOUS

## 2013-01-07 MED ORDER — SODIUM HYALURONATE 10 MG/ML IO SOLN
INTRAOCULAR | Status: DC | PRN
  Administered 2013-01-07: 0.85 mL via INTRAOCULAR

## 2013-01-07 MED ORDER — ONDANSETRON HCL 4 MG/2ML IJ SOLN
INTRAMUSCULAR | Status: DC | PRN
  Administered 2013-01-07: 4 mg via INTRAVENOUS

## 2013-01-07 MED ORDER — TRIAMCINOLONE ACETONIDE 40 MG/ML IJ SUSP
INTRAMUSCULAR | Status: AC
  Filled 2013-01-07: qty 5

## 2013-01-07 MED ORDER — LIDOCAINE HCL 2 % IJ SOLN
INTRAMUSCULAR | Status: AC
  Filled 2013-01-07: qty 20

## 2013-01-07 MED ORDER — GATIFLOXACIN 0.5 % OP SOLN
OPHTHALMIC | Status: AC
  Filled 2013-01-07: qty 2.5

## 2013-01-07 MED ORDER — LIDOCAINE HCL (PF) 1 % IJ SOLN
INTRAMUSCULAR | Status: AC
  Filled 2013-01-07: qty 30

## 2013-01-07 MED ORDER — TETRACAINE HCL 0.5 % OP SOLN
OPHTHALMIC | Status: AC
  Filled 2013-01-07: qty 2

## 2013-01-07 MED ORDER — MITOMYCIN 0.2 MG OP KIT
0.2000 mg | PACK | Freq: Once | OPHTHALMIC | Status: AC
  Administered 2013-01-07: .4 mg via OPHTHALMIC
  Filled 2013-01-07: qty 1

## 2013-01-07 MED ORDER — LIDOCAINE HCL 2 % EX GEL
Freq: Once | CUTANEOUS | Status: DC
  Filled 2013-01-07: qty 5

## 2013-01-07 MED ORDER — HYALURONIDASE HUMAN 150 UNIT/ML IJ SOLN
INTRAMUSCULAR | Status: AC
  Filled 2013-01-07: qty 1

## 2013-01-07 SURGICAL SUPPLY — 52 items
APPLICATOR COTTON TIP 6IN STRL (MISCELLANEOUS) ×2 IMPLANT
APPLICATOR DR MATTHEWS STRL (MISCELLANEOUS) ×2 IMPLANT
BLADE EYE CATARACT 19 1.4 BEAV (BLADE) ×2 IMPLANT
BLADE MINI RND TIP GREEN BEAV (BLADE) IMPLANT
BLADE STAB KNIFE 45DEG (BLADE) ×2 IMPLANT
BLADE SURG 15 STRL LF DISP TIS (BLADE) IMPLANT
BLADE SURG 15 STRL SS (BLADE)
CANISTER SUCTION 2500CC (MISCELLANEOUS) ×2 IMPLANT
CLOTH BEACON ORANGE TIMEOUT ST (SAFETY) IMPLANT
CORDS BIPOLAR (ELECTRODE) ×2 IMPLANT
COVER MAYO STAND STRL (DRAPES) IMPLANT
DRAPE OPHTHALMIC 40X48 W POUCH (DRAPES) ×2 IMPLANT
DRAPE RETRACTOR (MISCELLANEOUS) ×2 IMPLANT
ERASER HMR WETFIELD 23G BP (MISCELLANEOUS) IMPLANT
GLOVE BIO SURGEON STRL SZ8 (GLOVE) ×2 IMPLANT
GLOVE BIO SURGEON STRL SZ8.5 (GLOVE) IMPLANT
GLOVE ECLIPSE 7.0 STRL STRAW (GLOVE) IMPLANT
GLOVE ORTHO TXT STRL SZ7.5 (GLOVE) ×2 IMPLANT
GLOVE SURG SS PI 6.5 STRL IVOR (GLOVE) ×6 IMPLANT
GLOVE SURG SS PI 7.0 STRL IVOR (GLOVE) ×2 IMPLANT
GOWN PREVENTION PLUS XLARGE (GOWN DISPOSABLE) ×2 IMPLANT
GOWN STRL NON-REIN LRG LVL3 (GOWN DISPOSABLE) ×8 IMPLANT
KIT BASIN OR (CUSTOM PROCEDURE TRAY) ×2 IMPLANT
KIT ROOM TURNOVER OR (KITS) ×2 IMPLANT
KNIFE GRIESHABER SHARP 2.5MM (MISCELLANEOUS) ×2 IMPLANT
MARKER SKIN DUAL TIP RULER LAB (MISCELLANEOUS) IMPLANT
NEEDLE 22X1 1/2 (OR ONLY) (NEEDLE) IMPLANT
NEEDLE 25GX 5/8IN NON SAFETY (NEEDLE) IMPLANT
NEEDLE HYPO 23GX1 LL BLUE HUB (NEEDLE) IMPLANT
NEEDLE HYPO 30X.5 LL (NEEDLE) ×2 IMPLANT
NS IRRIG 1000ML POUR BTL (IV SOLUTION) ×2 IMPLANT
PACK CATARACT CUSTOM (CUSTOM PROCEDURE TRAY) ×2 IMPLANT
PAD ARMBOARD 7.5X6 YLW CONV (MISCELLANEOUS) ×4 IMPLANT
SHUNT EXPRS GLAUCOMA MINI P200 (Intraocular Lens) ×2 IMPLANT
SPEAR EYE SURG WECK-CEL (MISCELLANEOUS) ×2 IMPLANT
SPECIMEN JAR SMALL (MISCELLANEOUS) IMPLANT
SUT ETHILON 10 0 CS140 6 (SUTURE) ×2 IMPLANT
SUT ETHILON 9 0 BV100 4 (SUTURE) ×2 IMPLANT
SUT ETHILON 9 0 TG140 8 (SUTURE) IMPLANT
SUT MERSILENE 6 0 S14 DA (SUTURE) IMPLANT
SUT SILK 6 0 G 6 (SUTURE) ×2 IMPLANT
SUT VICRYL 8 0 TG140 8 (SUTURE) IMPLANT
SUT VICRYL 9-0 (SUTURE) ×2 IMPLANT
SYR 20CC LL (SYRINGE) ×6 IMPLANT
SYR 50ML SLIP (SYRINGE) IMPLANT
SYR TB 1ML LUER SLIP (SYRINGE) IMPLANT
TAPE SURG TRANSPORE 1 IN (GAUZE/BANDAGES/DRESSINGS) ×1 IMPLANT
TAPE SURGICAL TRANSPORE 1 IN (GAUZE/BANDAGES/DRESSINGS) ×1
TOWEL OR 17X24 6PK STRL BLUE (TOWEL DISPOSABLE) ×4 IMPLANT
TUBE CONNECTING 12X1/4 (SUCTIONS) ×2 IMPLANT
WATER STERILE IRR 1000ML POUR (IV SOLUTION) ×2 IMPLANT
WIPE INSTRUMENT VISIWIPE 73X73 (MISCELLANEOUS) ×2 IMPLANT

## 2013-01-07 NOTE — Preoperative (Signed)
Beta Blockers   Reason not to administer Beta Blockers:Not Applicable 

## 2013-01-07 NOTE — Transfer of Care (Signed)
Immediate Anesthesia Transfer of Care Note  Patient: [name] [name]  Procedure(s) Performed: Procedure(s): TRABECULECTOMY (Left) MINI TUBE WITH MITOMYCIN C LEFT EYE  (Left)  Patient Location: PACU  Anesthesia Type:MAC  Level of Consciousness: awake, alert , oriented and sedated  Airway & Oxygen Therapy: Patient Spontanous Breathing  Post-op Assessment: Report given to PACU RN, Post -op Vital signs reviewed and stable and Patient moving all extremities  Post vital signs: Reviewed and stable  Complications: No apparent anesthesia complications

## 2013-01-07 NOTE — Op Note (Signed)
Preoperative diagnosis: Uncontrolled glaucoma with pre-existing scarring from previous failed glaucoma surgery left eye Postoperative diagnosis: Same Procedure glaucoma device with mitomycin-C Anesthesia: Topical Xylocaine jelly topical 2% Xylocaine and intraocular preservative-free Xylocaine 1% Complications: None Procedure: The patient was taken to the operating room where he was given a peribulbar block with the aforementioned local anesthetic agent. Following this the patient's face was prepped and draped in the usual sterile fashion. With the surgeon sitting at 12:00 the speculum was inserted and operating microscope was into position a muscle hook was used to infraducted eye and a 6-0 nylon suture was passed through clear cornea to infraducted the eye following this it was noted that the patient had Ace bandage cystic nonfunctional superior bleb therefore the incision had to be made superior temporal an incision was made high in the fornix using the Wescott scissors and the incision was extended toward the limbus forming a limbal-based conjunctival flap the T9 tissue was thick a Tooke blade was used at the limbus to separate tenons fibers and tenons fibers  Were recessed bleeding was controlled with cautery. Following this a 45 blade was used to fashion a half thickness scleral flap with the base at the limbus at this point the minus all a concentration of 0.4 mg per cc heparin placed on a Gelfoam sponge and 2 sponges were placed under the conjunctiva and under the sclera and allowed to stay on half of 4 minutes the sponges were then removed and the eye was irrigated with 50 cc of balanced salt solution following this a paracentesis tract was formed through clear cornea using the degrees Hopper 5100 blade and Provisc was injected in the anterior chamber after Xylocaine 1% preservative-free had been injected in the anterior chamber at this point the scleral flap was elevated and using a 26-gauge needle on  the Provisc entrance was made into the anterior chamber the lot, device was examined and noted to have no defects it was an express glaucoma filtration device lot [id number] it was passed through the track created with the needle and positioned the scleral flap was then sutured with 4 interrupted 10-0 nylon sutures the anterior chamber remained formed throughout the entire procedure. Following this the conjunctiva was closed with a 9-0 Vicryl on a BV 100 needle BSS was injected in the anterior chamber and the Provisc was allowed to egressed out of the paracentesis tract the bleb elevated and the incision was Seidel negative with fluorescein staining. A  subconjunctival injection of Kenalog 4 mg was given in the inferior nasal quadrant. Topical TobraDex ointment was applied to the eye a patch and Fox U. were placed and the patient returned to recovery area in stable condition Chalmers Guest Junior M.D.

## 2013-01-07 NOTE — Anesthesia Preprocedure Evaluation (Signed)
Anesthesia Evaluation  Patient identified by MRN, date of birth, ID band Patient awake    Reviewed: Allergy & Precautions, H&P , NPO status , Patient's Chart, lab work & pertinent test results  History of Anesthesia Complications Negative for: history of anesthetic complications  Airway Mallampati: II      Dental  (+) Poor Dentition   Pulmonary  breath sounds clear to auscultation        Cardiovascular hypertension, Rhythm:Regular Rate:Normal     Neuro/Psych    GI/Hepatic   Endo/Other  diabetes, Type 2, Oral Hypoglycemic Agents  Renal/GU      Musculoskeletal   Abdominal   Peds  Hematology   Anesthesia Other Findings   Reproductive/Obstetrics                           Anesthesia Physical Anesthesia Plan  ASA: III  Anesthesia Plan: MAC   Post-op Pain Management:    Induction: Intravenous  Airway Management Planned: Natural Airway and Nasal Cannula  Additional Equipment:   Intra-op Plan:   Post-operative Plan: Extubation in OR  Informed Consent: I have reviewed the patients History and Physical, chart, labs and discussed the procedure including the risks, benefits and alternatives for the proposed anesthesia with the patient or authorized representative who has indicated his/her understanding and acceptance.     Plan Discussed with: CRNA and Surgeon  Anesthesia Plan Comments:         Anesthesia Quick Evaluation

## 2013-01-07 NOTE — Interval H&P Note (Signed)
History and Physical Interval Note:  01/07/2013 10:28 AM  [name] [name]  has presented today for surgery, with the diagnosis of LEFT EYE SEVERE STAGE GLAUCOMA  The various methods of treatment have been discussed with the patient and family. After consideration of risks, benefits and other options for treatment, the patient has consented to  Procedure(s): TRABECULECTOMY WITH POSSIBLE  (Left) MINI TUBE WITH MITOMYCIN C LEFT EYE  (Left) as a surgical intervention .  The patient's history has been reviewed, patient examined, no change in status, stable for surgery.  I have reviewed the patient's chart and labs.  Questions were answered to the patient's satisfaction.     [name]

## 2013-01-07 NOTE — Op Note (Signed)
Preoperative diagnosis: Uncontrolled glaucoma with pre-existing scarring from previous failed glaucoma surgery left eye Postoperative diagnosis: Same Procedure glaucoma device with mitomycin-C Anesthesia: Topical Xylocaine jelly topical 2% Xylocaine and intraocular preservative-free Xylocaine 1% Complications: None Procedure: The patient was taken to the operating room where he was given a peribulbar block with the aforementioned local anesthetic agent. Following this the patient's face was prepped and draped in the usual sterile fashion. With the surgeon sitting at 12:00 the speculum was inserted and operating microscope was into position a muscle hook was used to infraducted eye and a 6-0 nylon suture was passed through clear cornea to infraducted the eye following this it was noted that the patient had Ace bandage cystic nonfunctional superior bleb therefore the incision had to be made superior temporal an incision was made high in the fornix using the Wescott scissors and the incision was extended toward the limbus forming a limbal-based conjunctival flap the T9 tissue was thick a Tooke blade was used at the limbus to separate tenons fibers and tenons fibers  Were recessed bleeding was controlled with cautery. Following this a 45 blade was used to fashion a half thickness scleral flap with the base at the limbus at this point the minus all a concentration of 0.4 mg per cc heparin placed on a Gelfoam sponge and 2 sponges were placed under the conjunctiva and under the sclera and allowed to stay on half of 4 minutes the sponges were then removed and the eye was irrigated with 50 cc of balanced salt solution following this a paracentesis tract was formed through clear cornea using the degrees Hopper 5100 blade and Provisc was injected in the anterior chamber after Xylocaine 1% preservative-free had been injected in the anterior chamber at this point the scleral flap was elevated and using a 26-gauge needle on  the Provisc entrance was made into the anterior chamber the lot, device was examined and noted to have no defects it was an express glaucoma filtration device lot #[id number] it was passed through the track created with the needle and positioned the scleral flap was then sutured with 4 interrupted 10-0 nylon sutures the anterior chamber remained formed throughout the entire procedure. Following this the conjunctiva was closed with a 9-0 Vicryl on a BV 100 needle BSS was injected in the anterior chamber and the Provisc was allowed to egressed out of the paracentesis tract the bleb elevated and the incision was Seidel negative with fluorescein staining. A  subconjunctival injection of Kenalog 4 mg was given in the inferior nasal quadrant. Topical TobraDex ointment was applied to the eye a patch and Fox U. were placed and the patient returned to recovery area in stable condition Eilah Common Junior M.D. 

## 2013-01-07 NOTE — H&P (View-Only) (Signed)
History & Physical:   DATE:   01-01-13  NAME:  [name] [name], [name] [name]      [id number]       HISTORY OF PRESENT ILLNESS: Chief Eye Complaints  Glaucoma  patient  Have some pain in OD  HPI: EYES: Reports symptoms of uncontrolled glaucoma in only seeing eye OS               ROS:   GEN- Constitutional: HENT: GEN - Endocrine: Reports symptoms of diabetes.    +++++      LUNGS/Respiratory:  HEART/Cardiovascular: Reports symptoms of hypertension.    +++++      ABD/Gastrointestinal:   Musculoskeletal (BJE): NEURO/Neurological: PSYCH/Psychiatric:    Is the pt oriented to time, place, person?  Mood depressed __ normal  agitated __            Physical Exam  ACTIVE PROBLEMS: Glaucoma, severe stage   ICD#365.73  Onset:   Initial Date:   uncontrolled OS despite MTMT with failure of previous glaucoma surgery  Visual Fields  diffuse loss OS  Elevated IOP OD Blindness, one eye, low vision other eye   ICD#369.10  Onset:   Initial Date:     Diabetic retinopathy, proliferative   ICD#362.02  Onset:   Initial Date:  SURGERIES: Revision glaucoma surgery  OS 12-31-2011    Pick List - Surgeries  MEDICATIONS: OD BALANCE            OS -2.00 +0.50 x 009 20/50-  Cosopt (Dorzolamide-Hydrochloride-Timolol Mal):    2.23%-0.68% solution    SIG-   1 milliliter(s)  drop     2 times a day             OU BID  Alphagan P: 0.1% solution SIG-  1 gtt in each affected eye 3 times a day for 30 days  6:30am last used TID OU  Travatan Z: 0.004% solution SIG-  1 gtt in each affected eye once a day (in the evening) for 30 days  OU QHS  No using gtt below  Cosopt: 2.23%-0.68% solution SIG-  1 gtt in each eye 2 times a day for 30 days  BID OU  Pred Forte: acetate 1% suspension  OD BI PRN for pain  REVIEW OF SYSTEMS: not found  TOBACCO: No exposure to tobacco.      Never smoker   ICD#V13.8  Tobacco use:     Tobacco cessation:  SOCIAL HISTORY: Single.   [employer]  FAMILY  HISTORY:   Family History - 1st Degree Relatives:  Mother dead.  ALLERGIES: Drug Allergies.  No Known.    PHYSICAL EXAMINATION: VS: BP: 200/116.  P: 89 /min.   VS: BMI: 31.3.  BP: 160/102.  H: 72.00 in.  P: 84 /min.  RR: 20 /min.  W: 230lbs 0oz.    Va   OD:20/NLP OS:20/60-  EYEGLASSES: OD:-2.00-0.075X022 OS:-1.25 SV LENE    MR  OD BALANCE            OS -2.00 +0.50 x 009 20/50-           Mortality: Full  SLE: Conjunctiva: +1-2 injection with cystic bleb OS -seidel w/extension to cornea  Cornea:   Decreased Tear Break-Up Time with Arcus OU, staining OD   edema OD w/ endothelia pigment ICD#V20.2  Onset:   Initial Date:    or defect  pupillary block OD  Anterior Chamber:  deep and quiet  O  Iris: brown  ZO:[id number]  noted Rubeosis OS:  PERIPHERAL  IRIDECTOMY   OS Lens:  OD:1+  nuclear  sclerosis    OS:1-2  nuclear  sclerosis    Vitreous:  CCT:  Ta   in mmHg:    OD:   70      OS: 31 Time 01/01/2013 17:22   Gonio: OS shows most  areas closed   Dilation:  Fundus:  optic nerve:   OD: 90% cup                                                   WU:[id number] Rim loss   Macula:  Clear    OS OD:                                                     OS:   Vessels: Periphery: poor view  Exam:    GENERAL: Appearance: General appearance can be described as well-nourished, well-developed, and in no acute distress .   HEAD, EARS, NOSE AND THROAT:   Ears-Nose (external) Inspection: Externally, nose and ears are normal in appearance and without scars, lesions, or nodules.      Hearing assessment shows no problems with normal conversation.    Nose exam, internally, reveals nasal mucosa, septum and turbinates are unremarkable.    Teeth, Gingiva, and Lip Exams: No lesions or evidence of infection.      Oropharynx demonstrates oral mucosa, salivary glands, tongue, tonsils, posterior pharynx, hard-soft palates are normal.       NECK:   Neck tissue exam demonstrates no  masses, symmetrical, and trachea is midline.      Thyroid exam reveals no masses, enlargements or tenderness .     LUNGS and RESPIRATORY:   Lung auscultation elicits no wheezing, rhonci, rales or rubs and with equal breath sounds.    Respiratory effort described as breathing is unlabored and chest movement is symmetrical.    Chest Percussion demonstrates no dullness, or hyperresonance.    Chest palpation reveals   no tactile fremitus .     HEART (Cardiovascular):   Heart auscultation discovers regular rate and rhythm; no murmur, gallop or rub. Normal heart sounds.    Abdominal aorta exam reveals normal exam.    Carotid artery findings include   normal, symmetrical pulses and without bruits.    Femoral arteries reveal normal pulse amplitudes.    Edema-Varicosity Exam: No significant peripheral edema or venous varicosities.      Pedal pulses demonstrate normal amplitudes and equal bilaterally .     ABDOMEN (Gastrointestinal):   Mass/Tenderness Exam: Neither are present.     MUSCULOSKELETAL (BJE):   Inspection-Palpation: No major bone, joint, tendon, or muscle changes.       Range of Motion:   Joints move freely and without pain. Gait and station demonstrate standing and walking are stable and functional.    Digit and Nails: Fingers-fingernails and toes-toenails are unremarkable.      NEUROLOGICAL: Alert and oriented. No major deficits of coordination or sensation. Cranial Nerves: No defects involving cranial nerves II-XII.      Reflexes: No deep tendon reflex deficits,  Babinski is negative.      Sensation: No abnormalities with testing of distal extremities to light touch.             PSYCHIATRIC:   Orientation is   intact to person, place and time.    Insight and judgment appear    both to be intact and appropriate.    Mood and affect are described as   normal mood and full affect .     SKIN:   Skin Inspection: No rashes or lesions. Skin Palpation:   Normal turgor and without  induration or nodules. +++++  ADMITTING DIAGNOSIS: Glaucoma, severe stage   ICD#365.73  uncontrolled OS despite MTMT with failure of previous glaucoma surgery  Visual Fields  diffuse loss OS  Elevated IOP OD Blindness, one eye, low vision other eye   ICD#369.10    Diabetic retinopathy, proliferative   ICD#362.02  SURGICAL TREATMENT PLAN: Glaucoma surgery OS with [facility]   Actions:    .   Referral to general practitioner Performed Date/Time: 01/01/2013 17:26 Ordering Provider: Chalmers Guest, Montez Hageman Related       Discuss with physician Chalmers Guest, Montez Hageman.

## 2013-01-08 NOTE — Anesthesia Postprocedure Evaluation (Signed)
  Anesthesia Post-op Note  Patient: [name] [name]  Procedure(s) Performed: Procedure(s): TRABECULECTOMY (Left) MINI TUBE WITH MITOMYCIN C LEFT EYE  (Left)  Patient Location: PACU  Anesthesia Type:General  Level of Consciousness: awake  Airway and Oxygen Therapy: Patient Spontanous Breathing  Post-op Pain: mild  Post-op Assessment: Post-op Vital signs reviewed  Post-op Vital Signs: stable  Complications: No apparent anesthesia complications

## 2013-01-12 ENCOUNTER — Encounter (HOSPITAL_COMMUNITY): Payer: Self-pay | Admitting: Ophthalmology

## 2013-04-24 ENCOUNTER — Encounter (HOSPITAL_COMMUNITY): Payer: Self-pay | Admitting: Pharmacy Technician

## 2013-04-30 ENCOUNTER — Encounter (HOSPITAL_COMMUNITY): Payer: Self-pay

## 2013-04-30 ENCOUNTER — Encounter (HOSPITAL_COMMUNITY)
Admission: RE | Admit: 2013-04-30 | Discharge: 2013-04-30 | Disposition: A | Discharge status: Home / Self Care | Payer: Medicare Other | Source: Ambulatory Visit | Attending: Ophthalmology | Admitting: Ophthalmology

## 2013-04-30 DIAGNOSIS — Z01812 Encounter for preprocedural laboratory examination: Secondary | ICD-10-CM | POA: Insufficient documentation

## 2013-04-30 LAB — CBC
HEMATOCRIT: 38.2 % — AB (ref 39.0–52.0)
Hemoglobin: 13.8 g/dL (ref 13.0–17.0)
MCH: 31.4 pg (ref 26.0–34.0)
MCHC: 36.1 g/dL — AB (ref 30.0–36.0)
MCV: 87 fL (ref 78.0–100.0)
Platelets: 256 10*3/uL (ref 150–400)
RBC: 4.39 MIL/uL (ref 4.22–5.81)
RDW: 13.2 % (ref 11.5–15.5)
WBC: 8.3 10*3/uL (ref 4.0–10.5)

## 2013-04-30 LAB — BASIC METABOLIC PANEL
BUN: 35 mg/dL — ABNORMAL HIGH (ref 6–23)
CO2: 25 mEq/L (ref 19–32)
Calcium: 10.2 mg/dL (ref 8.4–10.5)
Chloride: 99 mEq/L (ref 96–112)
Creatinine, Ser: 1.29 mg/dL (ref 0.50–1.35)
GFR calc non Af Amer: 56 mL/min — ABNORMAL LOW (ref >90)
GFR, EST AFRICAN AMERICAN: 65 mL/min — AB (ref >90)
Glucose, Bld: 161 mg/dL — ABNORMAL HIGH (ref 70–99)
Potassium: 3.8 mEq/L (ref 3.7–5.3)
Sodium: 139 mEq/L (ref 137–147)

## 2013-04-30 NOTE — Pre-Procedure Instructions (Signed)
[name] HopesCarl L [name]  04/30/2013   Your procedure is scheduled on:  Wednesday, March 11  [facility], Admitting at 0800 AM.  Call this number if you have problems the morning of surgery: [phone]   Remember:   Do not eat food or drink liquids after midnight.Tuesday night   Take these medicines the morning of surgery with A SIP OF WATER: none   Do not wear jewelry,  Do not wear lotions, powders, or perfumes. You may wear deodorant.     Do not bring valuables to the hospital.  [facility] is not responsible  for any belongings or valuables.               Contacts, dentures or bridgework may not be worn into surgery.                Patients discharged the day of surgery will not be allowed to drive  home.  Name and phone number of your driver:   Special Instructions:[place] - Preparing for Surgery  Before surgery, you can play an important role.  Because skin is not sterile, your skin needs to be as free of germs as possible.  You can reduce the number of germs on you skin by washing with CHG (chlorahexidine gluconate) soap before surgery.  CHG is an antiseptic cleaner which kills germs and bonds with the skin to continue killing germs even after washing.  Please DO NOT use if you have an allergy to CHG or antibacterial soaps.  If your skin becomes reddened/irritated stop using the CHG and inform your nurse when you arrive at Short Stay.  Do not shave (including legs and underarms) for at least 48 hours prior to the first CHG shower.  You may shave your face.  Please follow these instructions carefully:   1.  Shower with CHG Soap the night before surgery and the  morning of Surgery.  2.  If you choose to wash your hair, wash your hair first as usual with your   normal shampoo.  3.  After you shampoo, rinse your hair and body thoroughly to remove the  Shampoo.  4.  Use CHG as you would any other liquid soap.  You can apply chg directly   to the skin and  wash gently with scrungie or a clean washcloth.  5.  Apply the CHG Soap to your body ONLY FROM THE NECK DOWN.   Do not use on open wounds or open sores.  Avoid contact with your eyes,       ears, mouth and genitals (private parts).  Wash genitals (private parts)  with your normal soap.  6.  Wash thoroughly, paying special attention to the area where your surgery  will be performed.  7.  Thoroughly rinse your body with warm water from the neck down.  8.  DO NOT shower/wash with your normal soap after using and rinsing off    the CHG Soap.  9.  Pat yourself dry with a clean towel.            10.  Wear clean pajamas.            11.  Place clean sheets on your bed the night of your first shower and do not   sleep with pets.  Day of Surgery  Do not apply any lotions/deoderants the morning of surgery.  Please wear clean clothes to the hospital/surgery center.     Please read  over the following fact sheets that you were given: Pain Booklet, Coughing and Deep Breathing and Surgical Site Infection Prevention

## 2013-05-01 ENCOUNTER — Other Ambulatory Visit: Payer: Self-pay | Admitting: Ophthalmology

## 2013-05-01 MED ORDER — TETRACAINE HCL 0.5 % OP SOLN: 1.0000 [drp] | OPHTHALMIC | Status: DC

## 2013-05-01 NOTE — H&P (Signed)
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 Onset: Initial Date:  Diabetic retinopathy, proliferative ICD#362.02 Onset: Initial Date:  SURGERIES:  Revision glaucoma surgery OS 12-31-2011  Pick List - Surgeries  MEDICATIONS:  OD BALANCE  OS -2.00 +0.50 x 009 20/50-  Cosopt (Dorzolamide-Hydrochloride-Timolol Mal): 2.23%-0.68% solution SIG- 1 milliliter(s) drop OD BID 2 times a day   Alphagan P: 0.1% solution SIG- 1 gtt in each affected eye 3 times a day for 30 days   Travatan Z: 0.004% solution SIG- 1 gtt in each affected eye once a day (in the evening) for 30   Pred Forte: acetate 1% suspension OD BI PRN for pain  REVIEW OF SYSTEMS:  ROS: GEN- Constitutional:  HENT:  GEN - Endocrine: Reports symptoms of diabetes.  +++++  LUNGS/Respiratory:  HEART/Cardiovascular: Reports symptoms of hypertension.  +++++  ABD/Gastrointestinal:  Musculoskeletal (BJE):  NEURO/Neurological:  PSYCH/Psychiatric: Is the pt oriented to time, place, person?  Mood depressed __ normal agitated _  TOBACCO:  No exposure to tobacco.  Never smoker ICD#V13.8 Tobacco use:  Tobacco cessation:  SOCIAL HISTORY:  Single. [employer] of the blind  FAMILY HISTORY:  Family History - 1st Degree Relatives: Mother dead.  ALLERGIES:  Drug Allergies. No Known.  PHYSICAL EXAMINATION:  VS: BP: 200/116. P: 89 /min.  VS: BMI: 31.3. BP: 160/102. H: 72.00 in. P: 84 /min. RR: 20 /min. W: 230lbs 0oz.  Va  OD:20/NLP  OS:20/60-  EYEGLASSES:  OD:-2.00-0.075X022  OS:-1.25  SV LENE  MR OD BALANCE  OS -2.00 +0.50 x 009 20/50-  Mortality: Full  SLE:  Conjunctiva: +1-2 injection with  cystic bleb OS -seidel w/extension to cornea  Cornea: CORNEAL EDEMA with Arcus OU, staining OD  edema OD w/ endothelia pigment ICD#V20.2 Onset: Initial Date: or defect pupillary block OD  Anterior Chamber: deep and quiet Ou  Iris: brown  ZO:XWRUEAOD:Bombay noted Rubeosis  OS: PERIPHERAL IRIDECTOMY OS  Lens:  OD:+2 nuclear sclerosis  OS:1-2 nuclear sclerosis  Vitreous:  CCT:  Ta in mmHg:  OD: 38 OS: 11   Gonio: OS shows most areas closed  Dilation:  Fundus:  optic nerve:  OD: POOR VIEW  VW:UJWJXBJYOS:Temporal Rim loss  Macula: Clear OS  OD: POOR VIEW OS:  Vessels:  Periphery: poor view  Exam:  GENERAL: Appearance: General appearance can be described as well-nourished, well-developed, and in no acute distress .  HEAD, EARS, NOSE AND THROAT:  Ears-Nose (external) Inspection: Externally, nose and ears are normal in appearance .   NECK:  Neck tissue exam demonstrates no masses, symmetrical, and trachea is midline.  Thyroid exam reveals no masses, enlargements or tenderness .  LUNGS and RESPIRATORY:  Lung auscultation elicits no wheezing, rhonci, rales or rubs and with equal breath sounds.  Respiratory effort described as breathing is unlabored and chest movement is symmetrical.  Chest Percussion demonstrates no dullness, or hyperresonance.  Chest palpation reveals no tactile fremitus .  HEART (Cardiovascular):  Heart auscultation discovers regular rate and rhythm; no murmur, gallop or rub. Normal heart sounds. Edema-Varicosity Exam: No significant peripheral edema or venous varicosities.  Pedal pulses demonstrate normal amplitudes and equal bilaterally .  ABDOMEN (Gastrointestinal):  Mass/Tenderness Exam: Neither are present.  MUSCULOSKELETAL (BJE):  Inspection-Palpation: No major bone, joint, tendon, or muscle changes.  Range of Motion: Joints move freely and without pain.  Gait and station demonstrate standing and walking are stable and functional.   NEUROLOGICAL:   Cranial Nerves: No  defects involving cranial nerves II-XII.  PSYCHIATRIC:  Orientation is intact to person, place and time.  Mood and affect are described as normal mood and full affect .  SKIN:  Skin Inspection: No rashes or lesions.   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

## 2013-05-05 MED ORDER — PREDNISOLONE ACETATE 1 % OP SUSP
1.0000 [drp] | OPHTHALMIC | Status: AC
  Administered 2013-05-06 (×3): 1 [drp] via OPHTHALMIC
  Filled 2013-05-05: qty 5

## 2013-05-05 MED ORDER — GATIFLOXACIN 0.5 % OP SOLN: 1.0000 [drp] | OPHTHALMIC | Status: AC

## 2013-05-06 ENCOUNTER — Encounter (HOSPITAL_COMMUNITY)
Admission: RE | Disposition: A | Discharge status: Home / Self Care | Payer: Self-pay | Source: Ambulatory Visit | Attending: Ophthalmology

## 2013-05-06 ENCOUNTER — Ambulatory Visit (HOSPITAL_COMMUNITY): Payer: Medicare Other | Admitting: Anesthesiology

## 2013-05-06 ENCOUNTER — Encounter (HOSPITAL_COMMUNITY): Payer: Medicare Other | Admitting: Anesthesiology

## 2013-05-06 ENCOUNTER — Encounter (HOSPITAL_COMMUNITY): Payer: Self-pay | Admitting: *Deleted

## 2013-05-06 ENCOUNTER — Ambulatory Visit (HOSPITAL_COMMUNITY)
Admission: RE | Admit: 2013-05-06 | Discharge: 2013-05-06 | Disposition: A | Discharge status: Home / Self Care | Payer: Medicare Other | Source: Ambulatory Visit | Attending: Ophthalmology | Admitting: Ophthalmology

## 2013-05-06 DIAGNOSIS — E11359 Type 2 diabetes mellitus with proliferative diabetic retinopathy without macular edema: Secondary | ICD-10-CM | POA: Insufficient documentation

## 2013-05-06 DIAGNOSIS — H547 Unspecified visual loss: Secondary | ICD-10-CM | POA: Insufficient documentation

## 2013-05-06 DIAGNOSIS — H4089 Other specified glaucoma: Secondary | ICD-10-CM | POA: Insufficient documentation

## 2013-05-06 DIAGNOSIS — I10 Essential (primary) hypertension: Secondary | ICD-10-CM | POA: Insufficient documentation

## 2013-05-06 DIAGNOSIS — E1139 Type 2 diabetes mellitus with other diabetic ophthalmic complication: Secondary | ICD-10-CM | POA: Insufficient documentation

## 2013-05-06 LAB — GLUCOSE, CAPILLARY: Glucose-Capillary: 129 mg/dL — ABNORMAL HIGH (ref 70–99)

## 2013-05-06 SURGERY — DESTRUCTION, CILIARY BODY, USING CRYOTHERAPY
Anesthesia: Monitor Anesthesia Care | Site: Eye | Laterality: Right

## 2013-05-06 MED ORDER — MIDAZOLAM HCL 5 MG/5ML IJ SOLN
INTRAMUSCULAR | Status: DC | PRN
  Administered 2013-05-06 (×2): 1 mg via INTRAVENOUS

## 2013-05-06 MED ORDER — HYDROCODONE-ACETAMINOPHEN 5-325 MG PO TABS
1.0000 | ORAL_TABLET | Freq: Four times a day (QID) | ORAL | Status: DC | PRN
Start: 2013-05-06 — End: 2015-06-03

## 2013-05-06 MED ORDER — 0.9 % SODIUM CHLORIDE (POUR BTL) OPTIME
TOPICAL | Status: DC | PRN
  Administered 2013-05-06: 1000 mL

## 2013-05-06 MED ORDER — LIDOCAINE HCL 2 % IJ SOLN
INTRAMUSCULAR | Status: AC
  Filled 2013-05-06: qty 20

## 2013-05-06 MED ORDER — BUPIVACAINE HCL (PF) 0.75 % IJ SOLN
INTRAMUSCULAR | Status: AC
  Filled 2013-05-06: qty 10

## 2013-05-06 MED ORDER — ACETAMINOPHEN-CODEINE #3 300-30 MG PO TABS: 1.0000 | ORAL_TABLET | ORAL | Status: DC | PRN

## 2013-05-06 MED ORDER — SODIUM CHLORIDE 0.9 % IV SOLN
INTRAVENOUS | Status: DC
  Administered 2013-05-06: 08:00:00 via INTRAVENOUS

## 2013-05-06 MED ORDER — TETRACAINE HCL 0.5 % OP SOLN
OPHTHALMIC | Status: DC | PRN
  Administered 2013-05-06: 2 [drp] via OPHTHALMIC

## 2013-05-06 MED ORDER — BSS IO SOLN
INTRAOCULAR | Status: DC | PRN
  Administered 2013-05-06: 15 mL via INTRAOCULAR

## 2013-05-06 MED ORDER — FENTANYL CITRATE 0.05 MG/ML IJ SOLN
INTRAMUSCULAR | Status: AC
  Filled 2013-05-06: qty 5

## 2013-05-06 MED ORDER — FENTANYL CITRATE 0.05 MG/ML IJ SOLN: 25.0000 ug | INTRAMUSCULAR | Status: DC | PRN

## 2013-05-06 MED ORDER — HYALURONIDASE HUMAN 150 UNIT/ML IJ SOLN
INTRAMUSCULAR | Status: AC
  Filled 2013-05-06: qty 1

## 2013-05-06 MED ORDER — HYPROMELLOSE (GONIOSCOPIC) 2.5 % OP SOLN
OPHTHALMIC | Status: AC
  Filled 2013-05-06: qty 15

## 2013-05-06 MED ORDER — ONDANSETRON HCL 4 MG/2ML IJ SOLN: 4.0000 mg | Freq: Once | INTRAMUSCULAR | Status: DC | PRN

## 2013-05-06 MED ORDER — TETRACAINE HCL 0.5 % OP SOLN
OPHTHALMIC | Status: AC
  Filled 2013-05-06: qty 2

## 2013-05-06 MED ORDER — PROPOFOL 10 MG/ML IV BOLUS
INTRAVENOUS | Status: AC
  Filled 2013-05-06: qty 20

## 2013-05-06 MED ORDER — LIDOCAINE-EPINEPHRINE 2 %-1:100000 IJ SOLN
INTRAMUSCULAR | Status: DC | PRN
  Administered 2013-05-06: 11:00:00 via RETROBULBAR

## 2013-05-06 MED ORDER — ATROPINE SULFATE 1 % OP SOLN
OPHTHALMIC | Status: AC
  Filled 2013-05-06: qty 2

## 2013-05-06 MED ORDER — MIDAZOLAM HCL 2 MG/2ML IJ SOLN
INTRAMUSCULAR | Status: AC
  Filled 2013-05-06: qty 2

## 2013-05-06 MED ORDER — LIDOCAINE-EPINEPHRINE 2 %-1:100000 IJ SOLN
INTRAMUSCULAR | Status: AC
  Filled 2013-05-06: qty 1

## 2013-05-06 MED ORDER — DEXAMETHASONE SODIUM PHOSPHATE 10 MG/ML IJ SOLN
INTRAMUSCULAR | Status: DC | PRN
  Administered 2013-05-06: 5 mg

## 2013-05-06 MED ORDER — PROPOFOL 10 MG/ML IV BOLUS
INTRAVENOUS | Status: DC | PRN
  Administered 2013-05-06 (×2): 20 mg via INTRAVENOUS

## 2013-05-06 MED ORDER — OXYCODONE HCL 5 MG/5ML PO SOLN
5.0000 mg | Freq: Once | ORAL | Status: DC | PRN
Start: 2013-05-06 — End: 2013-05-06

## 2013-05-06 MED ORDER — FENTANYL CITRATE 0.05 MG/ML IJ SOLN
INTRAMUSCULAR | Status: DC | PRN
  Administered 2013-05-06: 50 ug via INTRAVENOUS

## 2013-05-06 MED ORDER — OXYCODONE HCL 5 MG PO TABS: 5.0000 mg | ORAL_TABLET | Freq: Once | ORAL | Status: DC | PRN

## 2013-05-06 MED ORDER — ONDANSETRON HCL 4 MG/2ML IJ SOLN
INTRAMUSCULAR | Status: DC | PRN
  Administered 2013-05-06: 4 mg via INTRAVENOUS

## 2013-05-06 MED ORDER — ATROPINE SULFATE 1 % OP SOLN
OPHTHALMIC | Status: DC | PRN
  Administered 2013-05-06: 1 [drp] via OPHTHALMIC

## 2013-05-06 MED ORDER — SODIUM CHLORIDE 0.9 % IV SOLN
INTRAVENOUS | Status: DC | PRN
  Administered 2013-05-06 (×2): via INTRAVENOUS

## 2013-05-06 MED ORDER — TOBRAMYCIN-DEXAMETHASONE 0.3-0.1 % OP OINT
TOPICAL_OINTMENT | OPHTHALMIC | Status: AC
  Filled 2013-05-06: qty 3.5

## 2013-05-06 MED ORDER — BSS IO SOLN
INTRAOCULAR | Status: AC
  Filled 2013-05-06: qty 15

## 2013-05-06 SURGICAL SUPPLY — 19 items
APPLICATOR COTTON TIP 6IN STRL (MISCELLANEOUS) ×3 IMPLANT
APPLICATOR DR MATTHEWS STRL (MISCELLANEOUS) ×3 IMPLANT
DRAPE OPHTHALMIC 77X100 STRL (CUSTOM PROCEDURE TRAY) ×3 IMPLANT
GLOVE BIO SURGEON STRL SZ7.5 (GLOVE) ×3 IMPLANT
GLOVE BIO SURGEON STRL SZ8 (GLOVE) ×3 IMPLANT
GOWN EXTRA PROTECTION XL (GOWNS) ×3 IMPLANT
GOWN STRL REUS W/ TWL LRG LVL3 (GOWN DISPOSABLE) ×1 IMPLANT
GOWN STRL REUS W/TWL LRG LVL3 (GOWN DISPOSABLE) ×2
KIT BASIN OR (CUSTOM PROCEDURE TRAY) ×3 IMPLANT
MASK EYE SHIELD (GAUZE/BANDAGES/DRESSINGS) ×3 IMPLANT
NEEDLE 22X1 1/2 (OR ONLY) (NEEDLE) ×3 IMPLANT
NS IRRIG 1000ML POUR BTL (IV SOLUTION) ×3 IMPLANT
PAD ARMBOARD 7.5X6 YLW CONV (MISCELLANEOUS) ×6 IMPLANT
PAD EYE OVAL STERILE LF (GAUZE/BANDAGES/DRESSINGS) ×3 IMPLANT
TAPE SURG TRANSPORE 1 IN (GAUZE/BANDAGES/DRESSINGS) ×1 IMPLANT
TAPE SURGICAL TRANSPORE 1 IN (GAUZE/BANDAGES/DRESSINGS) ×2
TOWEL OR 17X24 6PK STRL BLUE (TOWEL DISPOSABLE) ×6 IMPLANT
WATER STERILE IRR 1000ML POUR (IV SOLUTION) ×3 IMPLANT
WIPE INSTRUMENT VISIWIPE 73X73 (MISCELLANEOUS) ×3 IMPLANT

## 2013-05-06 NOTE — Op Note (Signed)
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 With the lid speculum inserted the calipers were used to measure 4 mm from the limbus the cry of tip was applied in the center of the 4 mm measurement at the 2:30 position and allowed to stay on the eye for 30 seconds at -65 several overlapping spots were placed superiorly avoiding the 9:00 and 3:00 position. Crile spots were applied down to the 5:00 position all spots being approximately 4 mm from the limbus. Balanced salt solution was applied to the eye it was noted that there was corneal edema present and conjunctival injection prior to the procedure. At the end of the procedure Decadron milligrams was given some conjunctiva in the superior temporal conjunctival space. Topical atropine 1% and topical TobraDex ointment were applied to the eye a patch and Fox U. were placed and the patient returned to recovery area in stable condition Chalmers Guestoy Eleshia Wooley Junior M.D.

## 2013-05-06 NOTE — H&P (View-Only) (Signed)
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 Onset: Initial Date:  Diabetic retinopathy, proliferative ICD#362.02 Onset: Initial Date:  SURGERIES:  Revision glaucoma surgery OS 12-31-2011  Pick List - Surgeries  MEDICATIONS:  OD BALANCE  OS -2.00 +0.50 x 009 20/50-  Cosopt (Dorzolamide-Hydrochloride-Timolol Mal): 2.23%-0.68% solution SIG- 1 milliliter(s) drop OD BID 2 times a day   Alphagan P: 0.1% solution SIG- 1 gtt in each affected eye 3 times a day for 30 days   Travatan Z: 0.004% solution SIG- 1 gtt in each affected eye once a day (in the evening) for 30   Pred Forte: acetate 1% suspension OD BI PRN for pain  REVIEW OF SYSTEMS:  ROS: GEN- Constitutional:  HENT:  GEN - Endocrine: Reports symptoms of diabetes.  +++++  LUNGS/Respiratory:  HEART/Cardiovascular: Reports symptoms of hypertension.  +++++  ABD/Gastrointestinal:  Musculoskeletal (BJE):  NEURO/Neurological:  PSYCH/Psychiatric: Is the pt oriented to time, place, person?  Mood depressed __ normal agitated _  TOBACCO:  No exposure to tobacco.  Never smoker ICD#V13.8 Tobacco use:  Tobacco cessation:  SOCIAL HISTORY:  Single. [employer] of the blind  FAMILY HISTORY:  Family History - 1st Degree Relatives: Mother dead.  ALLERGIES:  Drug Allergies. No Known.  PHYSICAL EXAMINATION:  VS: BP: 200/116. P: 89 /min.  VS: BMI: 31.3. BP: 160/102. H: 72.00 in. P: 84 /min. RR: 20 /min. W: 230lbs 0oz.  Va  OD:20/NLP  OS:20/60-  EYEGLASSES:  OD:-2.00-0.075X022  OS:-1.25  SV LENE  MR OD BALANCE  OS -2.00 +0.50 x 009 20/50-  Mortality: Full  SLE:  Conjunctiva: +1-2 injection with  cystic bleb OS -seidel w/extension to cornea  Cornea: CORNEAL EDEMA with Arcus OU, staining OD  edema OD w/ endothelia pigment ICD#V20.2 Onset: Initial Date: or defect pupillary block OD  Anterior Chamber: deep and quiet Ou  Iris: brown  ZO:XWRUEAOD:Bombay noted Rubeosis  OS: PERIPHERAL IRIDECTOMY OS  Lens:  OD:+2 nuclear sclerosis  OS:1-2 nuclear sclerosis  Vitreous:  CCT:  Ta in mmHg:  OD: 38 OS: 11   Gonio: OS shows most areas closed  Dilation:  Fundus:  optic nerve:  OD: POOR VIEW  VW:UJWJXBJYOS:Temporal Rim loss  Macula: Clear OS  OD: POOR VIEW OS:  Vessels:  Periphery: poor view  Exam:  GENERAL: Appearance: General appearance can be described as well-nourished, well-developed, and in no acute distress .  HEAD, EARS, NOSE AND THROAT:  Ears-Nose (external) Inspection: Externally, nose and ears are normal in appearance .   NECK:  Neck tissue exam demonstrates no masses, symmetrical, and trachea is midline.  Thyroid exam reveals no masses, enlargements or tenderness .  LUNGS and RESPIRATORY:  Lung auscultation elicits no wheezing, rhonci, rales or rubs and with equal breath sounds.  Respiratory effort described as breathing is unlabored and chest movement is symmetrical.  Chest Percussion demonstrates no dullness, or hyperresonance.  Chest palpation reveals no tactile fremitus .  HEART (Cardiovascular):  Heart auscultation discovers regular rate and rhythm; no murmur, gallop or rub. Normal heart sounds. Edema-Varicosity Exam: No significant peripheral edema or venous varicosities.  Pedal pulses demonstrate normal amplitudes and equal bilaterally .  ABDOMEN (Gastrointestinal):  Mass/Tenderness Exam: Neither are present.  MUSCULOSKELETAL (BJE):  Inspection-Palpation: No major bone, joint, tendon, or muscle changes.  Range of Motion: Joints move freely and without pain.  Gait and station demonstrate standing and walking are stable and functional.   NEUROLOGICAL:   Cranial Nerves: No  defects involving cranial nerves II-XII.  PSYCHIATRIC:  Orientation is intact to person, place and time.  Mood and affect are described as normal mood and full affect .  SKIN:  Skin Inspection: No rashes or lesions.   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

## 2013-05-06 NOTE — Transfer of Care (Signed)
Immediate Anesthesia Transfer of Care Note  Patient: [name] HopesCarl L [name]  Procedure(s) Performed: Procedure(s): CYCLOCRYOTHERAPY RIGHT EYE  (Right)  Patient Location: PACU  Anesthesia Type:MAC  Level of Consciousness: awake, alert , oriented and sedated  Airway & Oxygen Therapy: Patient Spontanous Breathing and Patient connected to nasal cannula oxygen  Post-op Assessment: Report given to PACU RN, Post -op Vital signs reviewed and stable and Patient moving all extremities  Post vital signs: Reviewed and stable  Complications: No apparent anesthesia complications

## 2013-05-06 NOTE — Interval H&P Note (Signed)
History and Physical Interval Note:  05/06/2013 10:22 AM  [name] [name]  has presented today for surgery, with the diagnosis of SEVERE STAGE GLACOMA WITH BLIND RIGHT EYE   The various methods of treatment have been discussed with the patient and family. After consideration of risks, benefits and other options for treatment, the patient has consented to  Procedure(s): CYCLOCRYOTHERAPY RIGHT EYE  (Right) as a surgical intervention .  The patient's history has been reviewed, patient examined, no change in status, stable for surgery.  I have reviewed the patient's chart and labs.  Questions were answered to the patient's satisfaction.     Kinnie Kaupp

## 2013-05-06 NOTE — Discharge Instructions (Signed)
Patient may remove the eye patch this afternoon at 4:00 and use the eye drops that the previously been using. He should continue his current glaucoma eye drops in the right eye. He may take the pain medicine ordered every 4 hours as needed for pain.

## 2013-05-06 NOTE — Preoperative (Signed)
Beta Blockers   Reason not to administer Beta Blockers:Not Applicable 

## 2013-05-06 NOTE — Anesthesia Preprocedure Evaluation (Signed)
Anesthesia Evaluation  Patient identified by MRN, date of birth, ID band Patient awake    Reviewed: Allergy & Precautions, H&P , NPO status , Patient's Chart, lab work & pertinent test results  Airway Mallampati: II TM Distance: >3 FB Neck ROM: Full    Dental  (+) Teeth Intact, Poor Dentition   Pulmonary  breath sounds clear to auscultation        Cardiovascular hypertension, Rhythm:Regular Rate:Normal     Neuro/Psych    GI/Hepatic   Endo/Other  diabetes  Renal/GU      Musculoskeletal   Abdominal   Peds  Hematology   Anesthesia Other Findings   Reproductive/Obstetrics                           Anesthesia Physical Anesthesia Plan  ASA: III  Anesthesia Plan: MAC   Post-op Pain Management:    Induction: Intravenous  Airway Management Planned: Natural Airway and Simple Face Mask  Additional Equipment:   Intra-op Plan:   Post-operative Plan:   Informed Consent: I have reviewed the patients History and Physical, chart, labs and discussed the procedure including the risks, benefits and alternatives for the proposed anesthesia with the patient or authorized representative who has indicated his/her understanding and acceptance.   Dental advisory given  Plan Discussed with: CRNA and Anesthesiologist  Anesthesia Plan Comments:         Anesthesia Quick Evaluation

## 2013-05-06 NOTE — Anesthesia Postprocedure Evaluation (Signed)
  Anesthesia Post-op Note  Patient: Birdie HopesCarl L Sensabaugh  Procedure(s) Performed: Procedure(s): CYCLOCRYOTHERAPY RIGHT EYE  (Right)  Patient Location: PACU  Anesthesia Type:MAC  Level of Consciousness: awake, alert  and oriented  Airway and Oxygen Therapy: Patient Spontanous Breathing  Post-op Pain: mild  Post-op Assessment: Post-op Vital signs reviewed, Patient's Cardiovascular Status Stable, Respiratory Function Stable, Patent Airway and Pain level controlled  Post-op Vital Signs: stable  Complications: No apparent anesthesia complications

## 2015-06-02 ENCOUNTER — Other Ambulatory Visit: Payer: Self-pay | Admitting: Ophthalmology

## 2015-06-07 ENCOUNTER — Encounter (HOSPITAL_COMMUNITY): Payer: Self-pay | Admitting: *Deleted

## 2015-06-07 NOTE — H&P (Signed)
                  History & Physical:   DATE:   05-11-2015  NAME:  [name], [name]      [id number]       HISTORY OF PRESENT ILLNESS: Chief Eye Complaints   No pain in OU per pt. Va seems to be worse per pt. Patient c/o OU getting dark at times. HGA1C 6.2 last checked   HPI: EYES: Reports symptoms of     LOCATION:   both eyes    QUALITY/COURSE:   Reports condition is worse   INTENSITY/SEVERITY:    Reports measurement ( or degree) as moderate   DURATION:   Reports the general length of symptoms to be months                ACTIVE PROBLEMS: Benign hypertension   ICD10: I10  ICD9: 401.1  Onset: 04/05/2015 10:20  Initial Date:   Diabetes, Type 2  ICD10: E11.9  ICD9: 250.00  Onset: 04/05/2015 10:20  Initial Date:    Blindness, one eye, low vision other eye   ICD10: H54.10  ICD9: 369.10 With severe pain right eye the pain goes and cons at different times    stable postoperative glaucoma device with mitomycin-C left eye Glaucoma, severe stage   ICD10:   ICD9: 365.73  Onset:   uncontrolled OD despite MTMT with failure of previous glaucoma surgery  Visual Fields  diffuse loss OS  Elevated IOP OD with pain Diabetic retinopathy, proliferative   ICD10: E11.359  ICD9: 362.02  Onset:  SURGERIES: Revision glaucoma surgery  OS 12-31-2011 Glaucoma device w [facility] OS 01-07-13    Pick List - Surgeries  MEDICATIONS: Durezol: 0.05% emulsion SIG-  QID OS one drop OS qd    Alphagan P: 0.1% solution SIG-  1 gtt in each affected eye 3 times a day for 30 days   Travatan Z: 0.004% solution SIG-   1 drop each eye at bedtime daily    Ocuflox: 0.3% solution SIG-  2 gtt in each affected eye 4 times a day for 7 days   Zymaxid: 0.5% solution SIG-  1 drop in OS BID   Acular (Ketorlac Tromethamine):   0.5% solution  SIG-  1 drop in OS QID  REVIEW OF SYSTEMS: ROS:   GEN- Constitutional: HENT: GEN - Endocrine: Reports symptoms of diabetes  LUNGS/Respiratory:  HEART/Cardiovascular: Reports symptoms of  hypertension ABD/Gastrointestinal:  Musculoskeletal (BJE): NEURO/Neurological: PSYCH/Psychiatric:    Is the pt oriented to time, place, person?  yes  Mood normal     TOBACCO: No exposure to tobacco.      Never smoker   ICD10: Z87.898 ICD9: V13.8  Tobacco use:     Tobacco cessation:  SOCIAL HISTORY: Single.   [employer] blind  FAMILY HISTORY: Family History - 1st Degree Relatives:  Mother dead.  ALLERGIES: Drug Allergies.  No Known.    PHYSICAL EXAMINATION: VS: BP: 200/116.  P: 89 /min.   VS: BMI: 31.3.  BP: 160/102.  H: 72.00 in.  P: 84 /min.  RR: 20 /min.  W: 230lbs 0oz.      Va  03/17/2013 16:38  OD:cc 20/NLP OS: cc 20/60-  EYEGLASSES: OD:-2.00-0.075X022 OS:-1.25  SV LENs  MR  OD BALANCE OS -2.00 +1.25 x 030       MR:03/17/2013 16:46  OD:Balance OS -2.75 +1.25 x 030   20/60   SV     Mortality: Full  SLE: Conjunctiva: superior   bleb filters  temporal 180 with negative Seidel  diffuse bleb OS -seidel quiet  Cornea:   Decreased Tear Break-Up Time with Arcus OU, staining OD   edema OD w/ endothelia pigment ICD10: Z00.121  ICD9: V20.2  Onset:    or defect  pupillary block OD    Anterior Chamber:  deep trace to +1 cell and flare tube with downward location  Iris: brown  OD:Brombay  noted Rubeosis OS:  PERIPHERAL  IRIDECTOMY   OS Lens:  OD:1+  nuclear  sclerosis    OS:1-2  nuclear  sclerosis    Vitreous:  CCT:  Ta   in mmHg:    OD: 34   OS: 16 Time  04/29/2013 17:11   Gonio: OS shows most  areas closed   Dilation:  Fundus:  optic nerve:   OD: 90% cup                                                   OS:Temporal Rim loss   Macula:  Clear     OD:                                                     OS:   Vessels: Periphery: Stroke or cerebrovascular accident   Exam: GENERAL: Appearance: HEAD, EARS, NOSE AND THROAT: Ears-Nose (external) Inspection: Externally, nose and ears are normal in appearance and without scars, lesions, or  nodules.      Hearing assessment shows no problems with normal conversation.      LUNGS and RESPIRATORY: Lung auscultation elicits no wheezing, rhonci, rales or rubs and with equal breath sounds.    Respiratory effort described as breathing is unlabored and chest movement is symmetrical.    HEART (Cardiovascular): Heart auscultation discovers regular rate and rhythm; no murmur, gallop or rub. Normal heart sounds.    ABDOMEN (Gastrointestinal): Mass/Tenderness Exam: Neither are present.     MUSCULOSKELETAL (BJE): Inspection-Palpation: No major bone, joint, tendon, or muscle changes.      NEUROLOGICAL: Alert and oriented. No major deficits of coordination or sensation.      PSYCHIATRIC: Insight and judgment appear  both to be intact and appropriate.    Mood and affect are described as normal mood and full affect.    SKIN: Skin Inspection: No rashes or lesions  ADMITTING DIAGNOSIS: Nuclear sclerosis   Blindness, one eye, low vision other eye   ICD10: H54.10 With severe pain right   Primary open-angle glaucoma, bilateral, severe stage   ICD10: H40.1133    Visual Fields  diffuse loss OS stable     Diabetic retinopathy, proliferative   ICD10: E11.359  ICD9: 362.02  Onset:  SURGICAL TREATMENT PLAN: phaco emulsion cataract extraction  with  intraocular lens implant   OS discussed   Risk and benefits of surgery have been reviewed with the patient and the patient agrees to proceed with the surgical procedure.    poor prognosis because of advanced glaucoma OS     Actions:     Handouts: Beta-Blockers, glaucoma , what is glaucoma?, glaucoma treatment.    ___________________________ [name] [name], Jr. Starter - Inactive Problems:  

## 2015-06-07 NOTE — Progress Notes (Signed)
Mr [name] [name] denies any chest paim, shortness of breath, has not had any cardiac studies.  Patient reports that A1C last month was 6.1. I instructed patient to check CBG to check CBG and if it is less than 70 to treat it with Glucose Gel, Glucose tablets or 1/2 cup of clear juice like apple juice or cranberry juice, or 1/2 cup of regular soda. (not cream soda). I instructed patient and his daughter to recheck CBG in 15 minutes and if CBG is not greater than 70, to  Call [phone] (pre- op). If it is before pre-op opens to retreat as before and recheck CBG in 15 minutes. I told patient to make note of time that liquid is taken and amount, that surgical time may have to be adjusted. Patient repeated the information

## 2015-06-08 ENCOUNTER — Ambulatory Visit (HOSPITAL_COMMUNITY)
Admission: RE | Admit: 2015-06-08 | Discharge: 2015-06-08 | Disposition: A | Discharge status: Home / Self Care | Payer: Medicare Other | Source: Ambulatory Visit | Attending: Ophthalmology | Admitting: Ophthalmology

## 2015-06-08 ENCOUNTER — Encounter (HOSPITAL_COMMUNITY): Payer: Self-pay | Admitting: *Deleted

## 2015-06-08 ENCOUNTER — Ambulatory Visit (HOSPITAL_COMMUNITY): Payer: Medicare Other | Admitting: Anesthesiology

## 2015-06-08 ENCOUNTER — Encounter (HOSPITAL_COMMUNITY)
Admission: RE | Disposition: A | Discharge status: Home / Self Care | Payer: Self-pay | Source: Ambulatory Visit | Attending: Ophthalmology

## 2015-06-08 DIAGNOSIS — E113599 Type 2 diabetes mellitus with proliferative diabetic retinopathy without macular edema, unspecified eye: Secondary | ICD-10-CM | POA: Insufficient documentation

## 2015-06-08 DIAGNOSIS — H2512 Age-related nuclear cataract, left eye: Secondary | ICD-10-CM | POA: Insufficient documentation

## 2015-06-08 DIAGNOSIS — H269 Unspecified cataract: Secondary | ICD-10-CM | POA: Diagnosis present

## 2015-06-08 DIAGNOSIS — I10 Essential (primary) hypertension: Secondary | ICD-10-CM | POA: Insufficient documentation

## 2015-06-08 HISTORY — DX: Hyperlipidemia, unspecified: E78.5

## 2015-06-08 HISTORY — PX: CATARACT EXTRACTION W/PHACO: SHX586

## 2015-06-08 LAB — CBC
HCT: 37.7 % — ABNORMAL LOW (ref 39.0–52.0)
Hemoglobin: 13.1 g/dL (ref 13.0–17.0)
MCH: 30.3 pg (ref 26.0–34.0)
MCHC: 34.7 g/dL (ref 30.0–36.0)
MCV: 87.3 fL (ref 78.0–100.0)
PLATELETS: 277 10*3/uL (ref 150–400)
RBC: 4.32 MIL/uL (ref 4.22–5.81)
RDW: 13.4 % (ref 11.5–15.5)
WBC: 5.8 10*3/uL (ref 4.0–10.5)

## 2015-06-08 LAB — BASIC METABOLIC PANEL
Anion gap: 10 (ref 5–15)
BUN: 23 mg/dL — ABNORMAL HIGH (ref 6–20)
CALCIUM: 9.2 mg/dL (ref 8.9–10.3)
CHLORIDE: 102 mmol/L (ref 101–111)
CO2: 23 mmol/L (ref 22–32)
Creatinine, Ser: 1.15 mg/dL (ref 0.61–1.24)
GFR calc non Af Amer: >60 mL/min (ref >60)
Glucose, Bld: 146 mg/dL — ABNORMAL HIGH (ref 65–99)
Potassium: 3.9 mmol/L (ref 3.5–5.1)
SODIUM: 135 mmol/L (ref 135–145)

## 2015-06-08 LAB — GLUCOSE, CAPILLARY
GLUCOSE-CAPILLARY: 140 mg/dL — AB (ref 65–99)
GLUCOSE-CAPILLARY: 139 mg/dL — AB (ref 65–99)

## 2015-06-08 SURGERY — PHACOEMULSIFICATION, CATARACT, WITH IOL INSERTION
Anesthesia: Monitor Anesthesia Care | Site: Eye | Laterality: Left

## 2015-06-08 MED ORDER — TOBRAMYCIN-DEXAMETHASONE 0.3-0.1 % OP OINT
TOPICAL_OINTMENT | OPHTHALMIC | Status: AC
  Filled 2015-06-08: qty 3.5

## 2015-06-08 MED ORDER — LIDOCAINE-EPINEPHRINE 2 %-1:100000 IJ SOLN
INTRAMUSCULAR | Status: DC | PRN
  Administered 2015-06-08: 4 mL via RETROBULBAR

## 2015-06-08 MED ORDER — PROPOFOL 10 MG/ML IV BOLUS
INTRAVENOUS | Status: AC
  Filled 2015-06-08: qty 20

## 2015-06-08 MED ORDER — BSS IO SOLN
INTRAOCULAR | Status: DC | PRN
  Administered 2015-06-08: 11:00:00

## 2015-06-08 MED ORDER — CYCLOPENTOLATE HCL 1 % OP SOLN
1.0000 [drp] | OPHTHALMIC | Status: AC
  Administered 2015-06-08 (×3): 1 [drp] via OPHTHALMIC
  Filled 2015-06-08: qty 2

## 2015-06-08 MED ORDER — GENTAMICIN SULFATE 40 MG/ML IJ SOLN
INTRAMUSCULAR | Status: AC
  Filled 2015-06-08: qty 2

## 2015-06-08 MED ORDER — KETOROLAC TROMETHAMINE 0.5 % OP SOLN
1.0000 [drp] | OPHTHALMIC | Status: AC
  Administered 2015-06-08 (×3): 1 [drp] via OPHTHALMIC
  Filled 2015-06-08: qty 5

## 2015-06-08 MED ORDER — LIDOCAINE HCL 2 % IJ SOLN
INTRAMUSCULAR | Status: AC
  Filled 2015-06-08: qty 20

## 2015-06-08 MED ORDER — ACETAMINOPHEN 325 MG PO TABS: 650.0000 mg | ORAL_TABLET | ORAL | Status: DC | PRN

## 2015-06-08 MED ORDER — ACETYLCHOLINE CHLORIDE 20 MG IO SOLR
INTRAOCULAR | Status: AC
  Filled 2015-06-08: qty 1

## 2015-06-08 MED ORDER — LIDOCAINE HCL (CARDIAC) 20 MG/ML IV SOLN
INTRAVENOUS | Status: AC
  Filled 2015-06-08: qty 5

## 2015-06-08 MED ORDER — TETRACAINE HCL 0.5 % OP SOLN
OPHTHALMIC | Status: AC
  Filled 2015-06-08: qty 2

## 2015-06-08 MED ORDER — TOBRAMYCIN 0.3 % OP OINT
TOPICAL_OINTMENT | OPHTHALMIC | Status: DC | PRN
  Administered 2015-06-08: 1 via OPHTHALMIC

## 2015-06-08 MED ORDER — LIDOCAINE-EPINEPHRINE 2 %-1:100000 IJ SOLN
INTRAMUSCULAR | Status: AC
  Filled 2015-06-08: qty 1

## 2015-06-08 MED ORDER — SODIUM CHLORIDE 0.9 % IV SOLN
INTRAVENOUS | Status: DC | PRN
  Administered 2015-06-08: 10:00:00 via INTRAVENOUS

## 2015-06-08 MED ORDER — EPINEPHRINE HCL 1 MG/ML IJ SOLN
INTRAMUSCULAR | Status: AC
  Filled 2015-06-08: qty 1

## 2015-06-08 MED ORDER — STERILE WATER FOR IRRIGATION IR SOLN
Status: DC | PRN
  Administered 2015-06-08: 100 mL

## 2015-06-08 MED ORDER — ONDANSETRON HCL 4 MG/2ML IJ SOLN: 4.0000 mg | Freq: Once | INTRAMUSCULAR | Status: DC | PRN

## 2015-06-08 MED ORDER — GATIFLOXACIN 0.5 % OP SOLN
1.0000 [drp] | OPHTHALMIC | Status: AC
  Administered 2015-06-08 (×3): 1 [drp] via OPHTHALMIC
  Filled 2015-06-08: qty 2.5

## 2015-06-08 MED ORDER — SODIUM HYALURONATE 10 MG/ML IO SOLN
INTRAOCULAR | Status: AC
  Filled 2015-06-08: qty 0.85

## 2015-06-08 MED ORDER — SODIUM CHLORIDE 0.9 % IV SOLN
INTRAVENOUS | Status: DC
  Administered 2015-06-08: 09:00:00 via INTRAVENOUS

## 2015-06-08 MED ORDER — SODIUM HYALURONATE 10 MG/ML IO SOLN
INTRAOCULAR | Status: DC | PRN
  Administered 2015-06-08: 0.85 mL via INTRAOCULAR

## 2015-06-08 MED ORDER — MIDAZOLAM HCL 2 MG/2ML IJ SOLN
INTRAMUSCULAR | Status: AC
  Filled 2015-06-08: qty 2

## 2015-06-08 MED ORDER — BUPIVACAINE HCL (PF) 0.75 % IJ SOLN
INTRAMUSCULAR | Status: AC
  Filled 2015-06-08: qty 10

## 2015-06-08 MED ORDER — HYDROMORPHONE HCL 1 MG/ML IJ SOLN: 0.5000 mg | INTRAMUSCULAR | Status: DC | PRN

## 2015-06-08 MED ORDER — 0.9 % SODIUM CHLORIDE (POUR BTL) OPTIME
TOPICAL | Status: DC | PRN
  Administered 2015-06-08: 100 mL

## 2015-06-08 MED ORDER — TROPICAMIDE 1 % OP SOLN
1.0000 [drp] | OPHTHALMIC | Status: AC
  Administered 2015-06-08 (×3): 1 [drp] via OPHTHALMIC
  Filled 2015-06-08: qty 3

## 2015-06-08 MED ORDER — TETRACAINE HCL 0.5 % OP SOLN
1.0000 [drp] | OPHTHALMIC | Status: AC
  Administered 2015-06-08: 1 [drp] via OPHTHALMIC
  Filled 2015-06-08: qty 2

## 2015-06-08 MED ORDER — PHENYLEPHRINE HCL 2.5 % OP SOLN
1.0000 [drp] | Freq: Once | OPHTHALMIC | Status: AC
  Administered 2015-06-08: 1 [drp] via OPHTHALMIC

## 2015-06-08 MED ORDER — BSS IO SOLN
INTRAOCULAR | Status: AC
  Filled 2015-06-08: qty 15

## 2015-06-08 MED ORDER — PHENYLEPHRINE HCL 2.5 % OP SOLN
1.0000 [drp] | OPHTHALMIC | Status: AC
  Administered 2015-06-08 (×3): 1 [drp] via OPHTHALMIC
  Filled 2015-06-08: qty 2

## 2015-06-08 MED ORDER — MIDAZOLAM HCL 5 MG/5ML IJ SOLN
INTRAMUSCULAR | Status: DC | PRN
  Administered 2015-06-08: 1 mg via INTRAVENOUS

## 2015-06-08 MED ORDER — PROPOFOL 10 MG/ML IV BOLUS
INTRAVENOUS | Status: DC | PRN
  Administered 2015-06-08: 80 mg via INTRAVENOUS

## 2015-06-08 MED ORDER — PILOCARPINE HCL 4 % OP SOLN
OPHTHALMIC | Status: AC
  Filled 2015-06-08: qty 15

## 2015-06-08 MED ORDER — NA CHONDROIT SULF-NA HYALURON 40-30 MG/ML IO SOLN
INTRAOCULAR | Status: DC | PRN
  Administered 2015-06-08: 0.5 mL via INTRAOCULAR

## 2015-06-08 MED ORDER — ACETYLCHOLINE CHLORIDE 20 MG IO SOLR
INTRAOCULAR | Status: DC | PRN
  Administered 2015-06-08: 1 mg via INTRAOCULAR
  Administered 2015-06-08: 5 mg via INTRAOCULAR

## 2015-06-08 MED ORDER — NA CHONDROIT SULF-NA HYALURON 40-30 MG/ML IO SOLN
INTRAOCULAR | Status: AC
  Filled 2015-06-08: qty 0.5

## 2015-06-08 MED ORDER — LIDOCAINE HCL (CARDIAC) 20 MG/ML IV SOLN
INTRAVENOUS | Status: DC | PRN
  Administered 2015-06-08: 60 mg via INTRAVENOUS

## 2015-06-08 MED ORDER — BSS IO SOLN
INTRAOCULAR | Status: AC
  Filled 2015-06-08: qty 500

## 2015-06-08 MED ORDER — DEXAMETHASONE SODIUM PHOSPHATE 10 MG/ML IJ SOLN
INTRAMUSCULAR | Status: AC
  Filled 2015-06-08: qty 1

## 2015-06-08 MED ORDER — HYALURONIDASE HUMAN 150 UNIT/ML IJ SOLN
INTRAMUSCULAR | Status: AC
  Filled 2015-06-08: qty 1

## 2015-06-08 SURGICAL SUPPLY — 30 items
APPLICATOR COTTON TIP 6IN STRL (MISCELLANEOUS) ×3 IMPLANT
APPLICATOR DR MATTHEWS STRL (MISCELLANEOUS) ×3 IMPLANT
BLADE KERATOME 2.75 (BLADE) ×2 IMPLANT
BLADE KERATOME 2.75MM (BLADE) ×1
CANNULA ANTERIOR CHAMBER 27GA (MISCELLANEOUS) ×3 IMPLANT
COVER MAYO STAND STRL (DRAPES) ×3 IMPLANT
DRAPE OPHTHALMIC 40X48 W POUCH (DRAPES) ×3 IMPLANT
DRAPE RETRACTOR (MISCELLANEOUS) ×3 IMPLANT
GLOVE BIO SURGEON STRL SZ8 (GLOVE) ×3 IMPLANT
GLOVE ECLIPSE 7.0 STRL STRAW (GLOVE) ×3 IMPLANT
GOWN STRL REUS W/ TWL LRG LVL3 (GOWN DISPOSABLE) ×3 IMPLANT
GOWN STRL REUS W/TWL LRG LVL3 (GOWN DISPOSABLE) ×6
KIT BASIN OR (CUSTOM PROCEDURE TRAY) ×3 IMPLANT
KIT ROOM TURNOVER OR (KITS) ×3 IMPLANT
LENS IOL ACRSF IQ PC 20.0 (Intraocular Lens) ×1 IMPLANT
LENS IOL ACRYSOF IQ POST 20.0 (Intraocular Lens) ×3 IMPLANT
NEEDLE 18GX1X1/2 (RX/OR ONLY) (NEEDLE) ×3 IMPLANT
NEEDLE 25GX 5/8IN NON SAFETY (NEEDLE) ×3 IMPLANT
NEEDLE FILTER BLUNT 18X 1/2SAF (NEEDLE) ×2
NEEDLE FILTER BLUNT 18X1 1/2 (NEEDLE) ×1 IMPLANT
NS IRRIG 1000ML POUR BTL (IV SOLUTION) ×3 IMPLANT
PACK CATARACT CUSTOM (CUSTOM PROCEDURE TRAY) ×3 IMPLANT
PAD ARMBOARD 7.5X6 YLW CONV (MISCELLANEOUS) ×3 IMPLANT
PAK PIK CVS CATARACT (OPHTHALMIC) ×3 IMPLANT
SUT ETHILON 10 0 CS140 6 (SUTURE) IMPLANT
SUT SILK 6 0 G 6 (SUTURE) IMPLANT
SYR TB 1ML LUER SLIP (SYRINGE) ×3 IMPLANT
TIP ABS 45DEG FLARED 0.9MM (TIP) ×3 IMPLANT
WATER STERILE IRR 1000ML POUR (IV SOLUTION) ×3 IMPLANT
WIPE INSTRUMENT VISIWIPE 73X73 (MISCELLANEOUS) ×3 IMPLANT

## 2015-06-08 NOTE — Discharge Instructions (Signed)
The patient may remove the eye patch today at 2:30. The patient should avoid rubbing the eye  The patient should use eyeglasses or sunglasses. The patient should sleep on his back or right side.

## 2015-06-08 NOTE — Transfer of Care (Signed)
Immediate Anesthesia Transfer of Care Note  Patient: [name] [name]  Procedure(s) Performed: Procedure(s): CATARACT EXTRACTION PHACO AND INTRAOCULAR LENS PLACEMENT (IOC) LEFT EYE (Left)  Patient Location: PACU  Anesthesia Type:MAC  Level of Consciousness: awake and alert   Airway & Oxygen Therapy: Patient Spontanous Breathing  Post-op Assessment: Report given to RN, Post -op Vital signs reviewed and stable and Patient moving all extremities  Post vital signs: Reviewed and stable  Last Vitals:  Filed Vitals:   06/08/15 0746  BP: 158/91  Pulse: 101  Temp: 36.7 C  Resp: 20    Complications: No apparent anesthesia complications

## 2015-06-08 NOTE — H&P (View-Only) (Signed)
History & Physical:   DATE:   05-11-2015  NAME:  [name] [name], [name] [name]      [phone]       HISTORY OF PRESENT ILLNESS: Chief Eye Complaints   No pain in OU per pt. Va seems to be worse per pt. Patient c/o OU getting dark at times. HGA1C 6.2 last checked   HPI: EYES: Reports symptoms of     LOCATION:   both eyes    QUALITY/COURSE:   Reports condition is worse   INTENSITY/SEVERITY:    Reports measurement ( or degree) as moderate   DURATION:   Reports the general length of symptoms to be months                ACTIVE PROBLEMS: Benign hypertension   ICD10: I10  ICD9: 401.1  Onset: 04/05/2015 10:20  Initial Date:   Diabetes, Type 2  ICD10: E11.9  ICD9: 250.00  Onset: 04/05/2015 10:20  Initial Date:    Blindness, one eye, low vision other eye   ICD10: H54.10  ICD9: 369.10 With severe pain right eye the pain goes and cons at different times    stable postoperative glaucoma device with mitomycin-C left eye Glaucoma, severe stage   ICD10:   ICD9: 365.73  Onset:   uncontrolled OD despite MTMT with failure of previous glaucoma surgery  Visual Fields  diffuse loss OS  Elevated IOP OD with pain Diabetic retinopathy, proliferative   ICD10: E11.359  ICD9: 362.02  Onset:  SURGERIES: Revision glaucoma surgery  OS 12-31-2011 [facility] OS 01-07-13    Pick List - Surgeries  MEDICATIONS: Durezol: 0.05% emulsion SIG-  QID OS one drop OS qd    Alphagan P: 0.1% solution SIG-  1 gtt in each affected eye 3 times a day for 30 days   Travatan Z: 0.004% solution SIG-   1 drop each eye at bedtime daily    Ocuflox: 0.3% solution SIG-  2 gtt in each affected eye 4 times a day for 7 days   Zymaxid: 0.5% solution SIG-  1 drop in OS BID   Acular (Ketorlac Tromethamine):   0.5% solution  SIG-  1 drop in OS QID  REVIEW OF SYSTEMS: ROS:   GEN- Constitutional: HENT: GEN - Endocrine: Reports symptoms of diabetes  LUNGS/Respiratory:  HEART/Cardiovascular: Reports symptoms of  hypertension ABD/Gastrointestinal:  Musculoskeletal (BJE): NEURO/Neurological: PSYCH/Psychiatric:    Is the pt oriented to time, place, person?  yes  Mood normal     TOBACCO: No exposure to tobacco.      Never smoker   ICD10: Q65.784Z87.898 ICD9: V13.8  Tobacco use:     Tobacco cessation:  SOCIAL HISTORY: Single.   [employer]  FAMILY HISTORY: Family History - 1st Degree Relatives:  Mother dead.  ALLERGIES: Drug Allergies.  No Known.    PHYSICAL EXAMINATION: VS: BP: 200/116.  P: 89 /min.   VS: BMI: 31.3.  BP: 160/102.  H: 72.00 in.  P: 84 /min.  RR: 20 /min.  W: 230lbs 0oz.      Va  03/17/2013 16:38  OD:cc 20/NLP OS: cc 20/60-  EYEGLASSES: OD:-2.00-0.075X022 OS:-1.25  SV LENs  MR  OD BALANCE OS -2.00 +1.25 x 030       MR:03/17/2013 16:46  ON:GEXBMWUOD:Balance OS -2.75 +1.25 x 030   20/60   SV     Mortality: Full  SLE: Conjunctiva: superior  bleb filters  temporal 180 with negative Seidel  diffuse bleb OS -seidel quiet  Cornea:   Decreased Tear Break-Up Time with Arcus OU, staining OD   edema OD w/ endothelia pigment ICD10: Z00.121  ICD9: V20.2  Onset:    or defect  pupillary block OD    Anterior Chamber:  deep trace to +1 cell and flare tube with downward location  Iris: brown  ZO:XWRUEAV  noted Rubeosis OS:  PERIPHERAL  IRIDECTOMY   OS Lens:  OD:1+  nuclear  sclerosis    OS:1-2  nuclear  sclerosis    Vitreous:  CCT:  Ta   in mmHg:    OD: 34   OS: 16 Time  04/29/2013 17:11   Gonio: OS shows most  areas closed   Dilation:  Fundus:  optic nerve:   OD: 90% cup                                                   WU:JWJXBJYN Rim loss   Macula:  Clear     OD:                                                     OS:   Vessels: Periphery: Stroke or cerebrovascular accident   Exam: GENERAL: Appearance: HEAD, EARS, NOSE AND THROAT: Ears-Nose (external) Inspection: Externally, nose and ears are normal in appearance and without scars, lesions, or  nodules.      Hearing assessment shows no problems with normal conversation.      LUNGS and RESPIRATORY: Lung auscultation elicits no wheezing, rhonci, rales or rubs and with equal breath sounds.    Respiratory effort described as breathing is unlabored and chest movement is symmetrical.    HEART (Cardiovascular): Heart auscultation discovers regular rate and rhythm; no murmur, gallop or rub. Normal heart sounds.    ABDOMEN (Gastrointestinal): Mass/Tenderness Exam: Neither are present.     MUSCULOSKELETAL (BJE): Inspection-Palpation: No major bone, joint, tendon, or muscle changes.      NEUROLOGICAL: Alert and oriented. No major deficits of coordination or sensation.      PSYCHIATRIC: Insight and judgment appear  both to be intact and appropriate.    Mood and affect are described as normal mood and full affect.    SKIN: Skin Inspection: No rashes or lesions  ADMITTING DIAGNOSIS: Nuclear sclerosis   Blindness, one eye, low vision other eye   ICD10: H54.10 With severe pain right   Primary open-angle glaucoma, bilateral, severe stage   ICD10: H40.1133    Visual Fields  diffuse loss OS stable     Diabetic retinopathy, proliferative   ICD10: E11.359  ICD9: 362.02  Onset:  SURGICAL TREATMENT PLAN: phaco emulsion cataract extraction  with  intraocular lens implant   OS discussed   Risk and benefits of surgery have been reviewed with the patient and the patient agrees to proceed with the surgical procedure.    poor prognosis because of advanced glaucoma OS     Actions:     Handouts: Beta-Blockers, glaucoma , what is glaucoma?, glaucoma treatment.    ___________________________ Chalmers Guest, Montez Hageman Starter - Inactive Problems:

## 2015-06-08 NOTE — Op Note (Signed)
Preoperative diagnosis: Visually significant cataract left eye following previous glaucoma surgery with large superior filtration bleb left eye Postop diagnosis: Same Procedure:  Phacoemulsification with intraocular lens implant left eye Complications: None Assistant: Higinio RogerMing Lee  Anesthesia:  2% Xylocaine with epinephrine in a 5050 mixture 0.75% MAINE WITH AMPLE Wydase Procedure: The patient was transported to the operating room where he was given a peribulbar block with the aforementioned local anesthetic agent pressure was applied to the eye. Patient's face was then prepped and draped in the usual sterile fashion. With the surgeon sitting temporally the operating microscope in position it was noted that there was a large superior filtration bleb therefore the surgical procedure took place away from the bleb not touching the bleb at all during the case. A Weck-Cel sponges used to fixate the globe and a 15 blade was used to enter through inferior clear cornea at the 5:30 position and Viscoat was injected in the anterior chamber. With an additional Weck-Cel sponge in place a 2.75 mm keratome blade was used in a stepwise fashion through temporal clear cornea to into the eye. A bent 25-gauge needle was used to incise anterior capsule and a continuous tear curvilinear capsulorhexis was formed the capsule forceps were used to remove the anterior capsule.BSS was used to hydro dissect the hydro delineate the nucleus and rotate the nucleus within the capsular bag.The phaco muscle case unit was then used to remove the epinucleus and sculpt the nucleus centrally using the snapper hook and the phaco tip the nucleus was divided into 3 quadrants and all nuclear fragments were removed from the eye the posterior capsule remained intact. The irrigation-aspiration device was then used to strip corcal fibers fthe posterior capsule and polish the posterior capsule. The posterior capsule remained intact therefore Provisc was  injected  intraocular lens implantThe intraocular lens implant was examined and noted to have no defects the lens was an Alcon AcrySof SN 60 WF IQ lens 20.0 dpt SN number [id number] the lens is placed in the lens injector and injected and rotated with a Kuglen hook.Miochol was injected in the eye irrigation-aspiration device  Was used to remove viscoelastic from the eye prior to inject the Miochol. A single 10-0 nylon suture was placed at the primary incision the eye was pressurized and there was no leakage. All instruments were removed from the eye topical Tobradex ointment was applied a patch and Fox U were placed and the patient returned to recovery area in stable condition.   Chalmers Guestoy Coleson Kant Junior M.D.

## 2015-06-08 NOTE — Anesthesia Procedure Notes (Signed)
Procedure Name: MAC Date/Time: 06/08/2015 10:17 AM Performed by: Coralee RudFLORES, Elieser Tetrick Pre-anesthesia Checklist: Patient identified, Emergency Drugs available, Suction available, Patient being monitored and Timeout performed Patient Re-evaluated:Patient Re-evaluated prior to inductionOxygen Delivery Method: Nasal cannula Placement Confirmation: positive ETCO2

## 2015-06-08 NOTE — Interval H&P Note (Signed)
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 After consideration of risks, benefits and other options for treatment, the patient has consented to  Procedure(s): CATARACT EXTRACTION PHACO AND INTRAOCULAR LENS PLACEMENT (IOC) LEFT EYE (Left) as a surgical intervention .  The patient's history has been reviewed, patient examined, no change in status, stable for surgery.  I have reviewed the patient's chart and labs.  Questions were answered to the patient's satisfaction.     Neveen Daponte

## 2015-06-08 NOTE — Anesthesia Postprocedure Evaluation (Signed)
Anesthesia Post Note  Patient: [name] [name]  Procedure(s) Performed: Procedure(s) (LRB): CATARACT EXTRACTION PHACO AND INTRAOCULAR LENS PLACEMENT (IOC) LEFT EYE (Left)  Patient location during evaluation: PACU Anesthesia Type: MAC Level of consciousness: awake, oriented, awake and alert and patient cooperative Pain management: pain level controlled Vital Signs Assessment: post-procedure vital signs reviewed and stable Respiratory status: spontaneous breathing and respiratory function stable Cardiovascular status: blood pressure returned to baseline and stable Postop Assessment: no backache and no headache Anesthetic complications: no    Last Vitals:  Filed Vitals:   06/08/15 1153 06/08/15 1201  BP: 117/71 125/79  Pulse: 62 70  Temp: 36.7 C 36.7 C  Resp: 14 13    Last Pain: There were no vitals filed for this visit.               [name] [name]

## 2015-06-08 NOTE — Anesthesia Preprocedure Evaluation (Signed)
Anesthesia Evaluation  Patient identified by MRN, date of birth, ID band Patient awake    Reviewed: Allergy & Precautions, NPO status , Patient's Chart, lab work & pertinent test results  Airway Mallampati: I  TM Distance: >3 FB Neck ROM: Full    Dental   Pulmonary    Pulmonary exam normal        Cardiovascular hypertension, Normal cardiovascular exam     Neuro/Psych    GI/Hepatic   Endo/Other  diabetes, Type 2, Oral Hypoglycemic Agents  Renal/GU      Musculoskeletal   Abdominal   Peds  Hematology   Anesthesia Other Findings   Reproductive/Obstetrics                             Anesthesia Physical Anesthesia Plan  ASA: II  Anesthesia Plan: MAC   Post-op Pain Management:    Induction: Intravenous  Airway Management Planned: Simple Face Mask  Additional Equipment:   Intra-op Plan:   Post-operative Plan:   Informed Consent: I have reviewed the patients History and Physical, chart, labs and discussed the procedure including the risks, benefits and alternatives for the proposed anesthesia with the patient or authorized representative who has indicated his/her understanding and acceptance.     Plan Discussed with: CRNA, Anesthesiologist and Surgeon  Anesthesia Plan Comments:         Anesthesia Quick Evaluation

## 2015-06-09 ENCOUNTER — Encounter (HOSPITAL_COMMUNITY): Payer: Self-pay | Admitting: Ophthalmology

## 2015-11-27 HISTORY — PX: GLAUCOMA SURGERY: SHX656

## 2016-02-19 ENCOUNTER — Encounter (HOSPITAL_COMMUNITY): Payer: Self-pay

## 2016-02-19 ENCOUNTER — Emergency Department (HOSPITAL_COMMUNITY)
Admission: EM | Admit: 2016-02-19 | Discharge: 2016-02-19 | Disposition: A | Discharge status: Home / Self Care | Payer: Medicare Other | Attending: Emergency Medicine | Admitting: Emergency Medicine

## 2016-02-19 DIAGNOSIS — Z7982 Long term (current) use of aspirin: Secondary | ICD-10-CM | POA: Diagnosis not present

## 2016-02-19 DIAGNOSIS — I10 Essential (primary) hypertension: Secondary | ICD-10-CM | POA: Diagnosis not present

## 2016-02-19 DIAGNOSIS — R739 Hyperglycemia, unspecified: Secondary | ICD-10-CM

## 2016-02-19 DIAGNOSIS — Z7984 Long term (current) use of oral hypoglycemic drugs: Secondary | ICD-10-CM | POA: Diagnosis not present

## 2016-02-19 DIAGNOSIS — E1165 Type 2 diabetes mellitus with hyperglycemia: Secondary | ICD-10-CM | POA: Diagnosis present

## 2016-02-19 LAB — CBC
HCT: 42.1 % (ref 39.0–52.0)
Hemoglobin: 15.1 g/dL (ref 13.0–17.0)
MCH: 30 pg (ref 26.0–34.0)
MCHC: 35.9 g/dL (ref 30.0–36.0)
MCV: 83.5 fL (ref 78.0–100.0)
PLATELETS: 245 10*3/uL (ref 150–400)
RBC: 5.04 MIL/uL (ref 4.22–5.81)
RDW: 12.4 % (ref 11.5–15.5)
WBC: 7.9 10*3/uL (ref 4.0–10.5)

## 2016-02-19 LAB — URINALYSIS, ROUTINE W REFLEX MICROSCOPIC
Bilirubin Urine: NEGATIVE
HGB URINE DIPSTICK: NEGATIVE
Ketones, ur: NEGATIVE mg/dL
LEUKOCYTES UA: NEGATIVE
NITRITE: NEGATIVE
PH: 5 (ref 5.0–8.0)
Protein, ur: NEGATIVE mg/dL
SPECIFIC GRAVITY, URINE: 1.025 (ref 1.005–1.030)

## 2016-02-19 LAB — BASIC METABOLIC PANEL
Anion gap: 9 (ref 5–15)
BUN: 28 mg/dL — AB (ref 6–20)
CALCIUM: 9.9 mg/dL (ref 8.9–10.3)
CO2: 22 mmol/L (ref 22–32)
CREATININE: 1.43 mg/dL — AB (ref 0.61–1.24)
Chloride: 94 mmol/L — ABNORMAL LOW (ref 101–111)
GFR calc Af Amer: 57 mL/min — ABNORMAL LOW (ref >60)
GFR, EST NON AFRICAN AMERICAN: 49 mL/min — AB (ref >60)
GLUCOSE: 545 mg/dL — AB (ref 65–99)
Potassium: 4.8 mmol/L (ref 3.5–5.1)
SODIUM: 125 mmol/L — AB (ref 135–145)

## 2016-02-19 LAB — CBG MONITORING, ED
GLUCOSE-CAPILLARY: 544 mg/dL — AB (ref 65–99)
Glucose-Capillary: 410 mg/dL — ABNORMAL HIGH (ref 65–99)
Glucose-Capillary: 294 mg/dL — ABNORMAL HIGH (ref 65–99)

## 2016-02-19 MED ORDER — INSULIN ASPART 100 UNIT/ML ~~LOC~~ SOLN
5.0000 [IU] | Freq: Once | SUBCUTANEOUS | Status: AC
  Administered 2016-02-19: 5 [IU] via INTRAVENOUS
  Filled 2016-02-19: qty 1

## 2016-02-19 MED ORDER — SODIUM CHLORIDE 0.9 % IV BOLUS (SEPSIS)
1000.0000 mL | Freq: Once | INTRAVENOUS | Status: AC
  Administered 2016-02-19: 1000 mL via INTRAVENOUS

## 2016-02-19 MED ORDER — SODIUM CHLORIDE 0.9 % IV BOLUS (SEPSIS)
2000.0000 mL | Freq: Once | INTRAVENOUS | Status: AC
  Administered 2016-02-19: 2000 mL via INTRAVENOUS

## 2016-02-19 NOTE — Discharge Instructions (Addendum)
Please read and follow all provided instructions.  Your diagnoses today include:  1. Hyperglycemia     Tests performed today include: Vital signs. See below for your results today.   Medications:  Increase Metformin to 1000mg  twice per day   Home care instructions:  Follow any educational materials contained in this packet. Reduce sugar in diet. Exercise.   Follow-up instructions: Please follow-up with your primary care provider for further evaluation of symptoms and treatment   Return instructions:  Please return to the Emergency Department if you do not get better, if you get worse, or new symptoms OR  - Fever (temperature greater than 101.78F)  - Bleeding that does not stop with holding pressure to the area    -Severe pain (please note that you may be more sore the day after your accident)  - Chest Pain  - Difficulty breathing  - Severe nausea or vomiting  - Inability to tolerate food and liquids  - Passing out  - Skin becoming red around your wounds  - Change in mental status (confusion or lethargy)  - New numbness or weakness    Please return if you have any other emergent concerns.  Additional Information:  Your vital signs today were: BP 140/99    Pulse 77    Temp 98.3 F (36.8 C) (Oral)    Resp 14    Ht 5' 10.5" (1.791 m)    Wt 105.2 kg    SpO2 100%    BMI 32.82 kg/m  If your blood pressure (BP) was elevated above 135/85 this visit, please have this repeated by your doctor within one month. --------------

## 2016-02-19 NOTE — ED Provider Notes (Signed)
[facility]-EMERGENCY DEPT Provider Note   CSN: [id number] Arrival date & time: 02/19/16  1343  History   Chief Complaint Chief Complaint  Patient presents with  . Hyperglycemia    HPI [name] [name] is a 70 y.o. male.  HPI  70 y.o. male with a hx of DM2, HTN, HLD, presents to the Emergency Department today complaining of hyperglycemia. Notes increase thirst and urination. X 2-3 weeks. Notes hx of the same. Pt takes Metformin 500mg  BID. Notes no fevers. No CP/SOB/ABD pain. No N/V/D. No headaches. No vision changes. No URI symptoms. No pain currently. Pt does note eating lots of sugary foods as he normally does for the holidays. No other symptoms noted.    Past Medical History:  Diagnosis Date  . Diabetes mellitus without complication (HCC)    Type 2 NIDDM x 1 year  . Glaucoma   . Hyperlipidemia   . Hypertension     There are no active problems to display for this patient.   Past Surgical History:  Procedure Laterality Date  . ANKLE FRACTURE SURGERY Right    with pinning  . CATARACT EXTRACTION W/PHACO Left 06/08/2015   Procedure: CATARACT EXTRACTION PHACO AND INTRAOCULAR LENS PLACEMENT (IOC) LEFT EYE;  Surgeon: Chalmers Guest, MD;  Location: [facility] OR;  Service: Ophthalmology;  Laterality: Left;  . COLONOSCOPY    . EYE SURGERY     Left Eye for Glaucoma  . MINI SHUNT INSERTION Left 01/07/2013   Procedure: MINI TUBE WITH MITOMYCIN C LEFT EYE ;  Surgeon: Chalmers Guest, MD;  Location: [facility] OR;  Service: Ophthalmology;  Laterality: Left;  . TRABECULECTOMY  12/31/2011   Procedure: TRABECULECTOMY;  Surgeon: Chalmers Guest, MD;  Location: [facility] OR;  Service: Ophthalmology;  Laterality: Left;  REVISED OPERATIVE WOUND POST GLAUCOMA SURGERY  . TRABECULECTOMY Left 01/07/2013   Procedure: TRABECULECTOMY;  Surgeon: Chalmers Guest, MD;  Location: [facility] OR;  Service: Ophthalmology;  Laterality: Left;       Home Medications    Prior to Admission medications   Medication Sig Start Date End Date Taking?  Authorizing Provider  amLODipine (NORVASC) 2.5 MG tablet Take 2.5 mg by mouth daily. 04/26/15   Historical Provider, MD  aspirin EC 81 MG tablet Take 81 mg by mouth daily.    Historical Provider, MD  atorvastatin (LIPITOR) 20 MG tablet Take 20 mg by mouth daily. 05/23/15   Historical Provider, MD  brimonidine (ALPHAGAN) 0.15 % ophthalmic solution Place 1 drop into both eyes 3 (three) times daily.    Historical Provider, MD  dorzolamide-timolol (COSOPT) 22.3-6.8 MG/ML ophthalmic solution Place 1 drop into the right eye 2 (two) times daily.    Historical Provider, MD  lisinopril-hydrochlorothiazide (PRINZIDE,ZESTORETIC) 10-12.5 MG per tablet Take 1 tablet by mouth daily.    Historical Provider, MD  metFORMIN (GLUCOPHAGE) 500 MG tablet Take 1 tablet (500 mg total) by mouth 2 (two) times daily with a meal. 10/20/12   Linna Hoff, MD  prednisoLONE acetate (PRED FORTE) 1 % ophthalmic suspension Place 1 drop into the right eye 2 (two) times daily.    Historical Provider, MD  travoprost, benzalkonium, (TRAVATAN) 0.004 % ophthalmic solution Place 1 drop into the left eye at bedtime.    Historical Provider, MD    Family History No family history on file.  Social History Social History  Substance Use Topics  . Smoking status: Never Smoker  . Smokeless tobacco: Not on file  . Alcohol use No     Allergies   Patient has  no known allergies.   Review of Systems Review of Systems ROS reviewed and all are negative for acute change except as noted in the HPI.  Physical Exam Updated Vital Signs BP 158/91 (BP Location: Left Arm)   Pulse 100   Temp 98.3 F (36.8 C) (Oral)   Resp 14   Ht 5' 10.5" (1.791 m)   Wt 105.2 kg   SpO2 97%   BMI 32.82 kg/m   Physical Exam  Constitutional: He is oriented to person, place, and time. Vital signs are normal. He appears well-developed and well-nourished.  HENT:  Head: Normocephalic and atraumatic.  Right Ear: Hearing normal.  Left Ear: Hearing normal.    Eyes: Conjunctivae and EOM are normal. Pupils are equal, round, and reactive to light.  Neck: Normal range of motion. Neck supple.  Cardiovascular: Normal rate, regular rhythm, normal heart sounds and intact distal pulses.   Pulmonary/Chest: Effort normal.  Abdominal: Soft. There is no tenderness.  Musculoskeletal: Normal range of motion.  Neurological: He is alert and oriented to person, place, and time.  Skin: Skin is warm and dry.  Psychiatric: He has a normal mood and affect. His speech is normal and behavior is normal. Thought content normal.  Nursing note and vitals reviewed.  ED Treatments / Results  Labs (all labs ordered are listed, but only abnormal results are displayed) Labs Reviewed  BASIC METABOLIC PANEL - Abnormal; Notable for the following:       Result Value   Sodium 125 (*)    Chloride 94 (*)    Glucose, Bld 545 (*)    BUN 28 (*)    Creatinine, Ser 1.43 (*)    GFR calc non Af Amer 49 (*)    GFR calc Af Amer 57 (*)    All other components within normal limits  URINALYSIS, ROUTINE W REFLEX MICROSCOPIC - Abnormal; Notable for the following:    Color, Urine STRAW (*)    Glucose, UA >=500 (*)    Bacteria, UA RARE (*)    Squamous Epithelial / LPF 0-5 (*)    All other components within normal limits  CBG MONITORING, ED - Abnormal; Notable for the following:    Glucose-Capillary 544 (*)    All other components within normal limits  CBC    EKG  EKG Interpretation None       Radiology No results found.  Procedures Procedures (including critical care time)  Medications Ordered in ED Medications - No data to display   Initial Impression / Assessment and Plan / ED Course  I have reviewed the triage vital signs and the nursing notes.  Pertinent labs & imaging results that were available during my care of the patient were reviewed by me and considered in my medical decision making (see chart for details).  Clinical Course    Final Clinical  Impressions(s) / ED Diagnoses  {I have reviewed and evaluated the relevant laboratory values.   {I have reviewed the relevant previous healthcare records.  {I obtained HPI from historian.   ED Course:  Assessment: Pt is a 70yM with hx DM2 who presents with hyperglycemia. On exam, pt in NAD. Nontoxic/nonseptic appearing. VSS. Afebrile. Lungs CTA. Heart RRR. Abdomen nontender soft. POC Glucose 544. CBC unremarkable. Potassium 4.8. Gap 9. Creatinine 1.43. UA unremarkable. No evidence of DKA. Given NS bolus 3L in ED as well as 10U Insulin. Hyperglycemia likely related to increase sugar in diet from holidays. Pt only takes metformin and cannot compensate for increase. Counseled  on diet control and close follow up to PCP. Will increase Metformin to 1000mg  BID. Close follow up to PCP. Plan is to DC home. At time of discharge, Patient is in no acute distress. Vital Signs are stable. Patient is able to ambulate. Patient able to tolerate PO.   Disposition/Plan:  DC Home Additional Verbal discharge instructions given and discussed with patient.  Pt Instructed to f/u with PCP in the next week for evaluation and treatment of symptoms. Return precautions given Pt acknowledges and agrees with plan  Supervising Physician Lyndal Pulleyaniel Knott, MD  Final diagnoses:  Hyperglycemia    New Prescriptions New Prescriptions   No medications on file     Audry Piliyler [place], PA-C 02/19/16 1835    Lyndal Pulleyaniel Knott, MD 02/20/16 [phone]

## 2016-02-19 NOTE — ED Triage Notes (Signed)
Patient complains of increased thirst, urination and hunger for several days. Dry mouth with same. States blood sugar been running high and today greater than 500. Alert and oriented, denies pain.

## 2016-02-28 ENCOUNTER — Observation Stay (HOSPITAL_COMMUNITY): Payer: Medicare Other

## 2016-02-28 ENCOUNTER — Observation Stay (HOSPITAL_COMMUNITY)
Admission: EM | Admit: 2016-02-28 | Discharge: 2016-02-29 | Disposition: A | Discharge status: Home / Self Care | Payer: Medicare Other | Attending: Family Medicine | Admitting: Family Medicine

## 2016-02-28 ENCOUNTER — Other Ambulatory Visit: Payer: Self-pay

## 2016-02-28 ENCOUNTER — Encounter (HOSPITAL_COMMUNITY): Payer: Self-pay

## 2016-02-28 DIAGNOSIS — N390 Urinary tract infection, site not specified: Secondary | ICD-10-CM | POA: Diagnosis not present

## 2016-02-28 DIAGNOSIS — E1165 Type 2 diabetes mellitus with hyperglycemia: Principal | ICD-10-CM | POA: Insufficient documentation

## 2016-02-28 DIAGNOSIS — Z79899 Other long term (current) drug therapy: Secondary | ICD-10-CM | POA: Diagnosis not present

## 2016-02-28 DIAGNOSIS — E118 Type 2 diabetes mellitus with unspecified complications: Secondary | ICD-10-CM

## 2016-02-28 DIAGNOSIS — N179 Acute kidney failure, unspecified: Secondary | ICD-10-CM | POA: Diagnosis not present

## 2016-02-28 DIAGNOSIS — E871 Hypo-osmolality and hyponatremia: Secondary | ICD-10-CM

## 2016-02-28 DIAGNOSIS — Z7982 Long term (current) use of aspirin: Secondary | ICD-10-CM | POA: Diagnosis not present

## 2016-02-28 DIAGNOSIS — I10 Essential (primary) hypertension: Secondary | ICD-10-CM

## 2016-02-28 DIAGNOSIS — R55 Syncope and collapse: Secondary | ICD-10-CM | POA: Insufficient documentation

## 2016-02-28 DIAGNOSIS — H548 Legal blindness, as defined in USA: Secondary | ICD-10-CM | POA: Insufficient documentation

## 2016-02-28 DIAGNOSIS — R739 Hyperglycemia, unspecified: Secondary | ICD-10-CM

## 2016-02-28 DIAGNOSIS — Z7984 Long term (current) use of oral hypoglycemic drugs: Secondary | ICD-10-CM | POA: Diagnosis not present

## 2016-02-28 DIAGNOSIS — E119 Type 2 diabetes mellitus without complications: Secondary | ICD-10-CM

## 2016-02-28 HISTORY — DX: Syncope and collapse: R55

## 2016-02-28 HISTORY — DX: Unspecified glaucoma: H40.9

## 2016-02-28 HISTORY — DX: Type 2 diabetes mellitus without complications: E11.9

## 2016-02-28 HISTORY — DX: Legal blindness, as defined in USA: H54.8

## 2016-02-28 LAB — CBC
HEMATOCRIT: 40 % (ref 39.0–52.0)
Hemoglobin: 13.8 g/dL (ref 13.0–17.0)
MCH: 29.2 pg (ref 26.0–34.0)
MCHC: 34.5 g/dL (ref 30.0–36.0)
MCV: 84.7 fL (ref 78.0–100.0)
Platelets: 262 10*3/uL (ref 150–400)
RBC: 4.72 MIL/uL (ref 4.22–5.81)
RDW: 13.1 % (ref 11.5–15.5)
WBC: 9.4 10*3/uL (ref 4.0–10.5)

## 2016-02-28 LAB — CBG MONITORING, ED
GLUCOSE-CAPILLARY: 260 mg/dL — AB (ref 65–99)
Glucose-Capillary: 343 mg/dL — ABNORMAL HIGH (ref 65–99)
Glucose-Capillary: 245 mg/dL — ABNORMAL HIGH (ref 65–99)

## 2016-02-28 LAB — BASIC METABOLIC PANEL
Anion gap: 12 (ref 5–15)
BUN: 31 mg/dL — AB (ref 6–20)
CHLORIDE: 95 mmol/L — AB (ref 101–111)
CO2: 21 mmol/L — AB (ref 22–32)
Calcium: 9.6 mg/dL (ref 8.9–10.3)
Creatinine, Ser: 1.9 mg/dL — ABNORMAL HIGH (ref 0.61–1.24)
GFR calc Af Amer: 40 mL/min — ABNORMAL LOW (ref >60)
GFR, EST NON AFRICAN AMERICAN: 34 mL/min — AB (ref >60)
GLUCOSE: 330 mg/dL — AB (ref 65–99)
POTASSIUM: 3.9 mmol/L (ref 3.5–5.1)
Sodium: 128 mmol/L — ABNORMAL LOW (ref 135–145)

## 2016-02-28 LAB — URINALYSIS, ROUTINE W REFLEX MICROSCOPIC
Bilirubin Urine: NEGATIVE
Glucose, UA: 150 mg/dL — AB
Hgb urine dipstick: NEGATIVE
KETONES UR: NEGATIVE mg/dL
Nitrite: NEGATIVE
PROTEIN: 100 mg/dL — AB
Specific Gravity, Urine: 1.016 (ref 1.005–1.030)
pH: 5 (ref 5.0–8.0)

## 2016-02-28 LAB — GLUCOSE, CAPILLARY: Glucose-Capillary: 180 mg/dL — ABNORMAL HIGH (ref 65–99)

## 2016-02-28 LAB — TROPONIN I: Troponin I: <0.03 ng/mL (ref <0.03)

## 2016-02-28 MED ORDER — ACETAMINOPHEN 325 MG PO TABS: 650.0000 mg | ORAL_TABLET | Freq: Four times a day (QID) | ORAL | Status: DC | PRN

## 2016-02-28 MED ORDER — CEPHALEXIN 250 MG PO CAPS
500.0000 mg | ORAL_CAPSULE | Freq: Once | ORAL | Status: AC
  Administered 2016-02-28: 500 mg via ORAL
  Filled 2016-02-28: qty 2

## 2016-02-28 MED ORDER — ACETAMINOPHEN 650 MG RE SUPP: 650.0000 mg | Freq: Four times a day (QID) | RECTAL | Status: DC | PRN

## 2016-02-28 MED ORDER — ENSURE ENLIVE PO LIQD: 237.0000 mL | Freq: Two times a day (BID) | ORAL | Status: DC

## 2016-02-28 MED ORDER — ONDANSETRON HCL 4 MG PO TABS: 4.0000 mg | ORAL_TABLET | Freq: Four times a day (QID) | ORAL | Status: DC | PRN

## 2016-02-28 MED ORDER — AMLODIPINE BESYLATE 5 MG PO TABS
2.5000 mg | ORAL_TABLET | Freq: Every day | ORAL | Status: DC
  Administered 2016-02-28: 2.5 mg via ORAL
  Filled 2016-02-28: qty 1

## 2016-02-28 MED ORDER — ENOXAPARIN SODIUM 40 MG/0.4ML ~~LOC~~ SOLN
40.0000 mg | SUBCUTANEOUS | Status: DC
  Administered 2016-02-28: 40 mg via SUBCUTANEOUS
  Filled 2016-02-28: qty 0.4

## 2016-02-28 MED ORDER — DEXTROSE 5 % IV SOLN
2.0000 g | INTRAVENOUS | Status: DC
  Administered 2016-02-28: 2 g via INTRAVENOUS
  Filled 2016-02-28 (×2): qty 2

## 2016-02-28 MED ORDER — ATORVASTATIN CALCIUM 20 MG PO TABS
20.0000 mg | ORAL_TABLET | Freq: Every day | ORAL | Status: DC
  Administered 2016-02-28 – 2016-02-29 (×2): 20 mg via ORAL
  Filled 2016-02-28 (×2): qty 1

## 2016-02-28 MED ORDER — SENNOSIDES-DOCUSATE SODIUM 8.6-50 MG PO TABS: 1.0000 | ORAL_TABLET | Freq: Every evening | ORAL | Status: DC | PRN

## 2016-02-28 MED ORDER — SODIUM CHLORIDE 0.9 % IV SOLN
INTRAVENOUS | Status: AC
Start: 2016-02-28 — End: 2016-02-29
  Administered 2016-02-28: 21:00:00 via INTRAVENOUS

## 2016-02-28 MED ORDER — ONDANSETRON HCL 4 MG/2ML IJ SOLN: 4.0000 mg | Freq: Four times a day (QID) | INTRAMUSCULAR | Status: DC | PRN

## 2016-02-28 MED ORDER — INSULIN ASPART 100 UNIT/ML ~~LOC~~ SOLN: 0.0000 [IU] | Freq: Every day | SUBCUTANEOUS | Status: DC

## 2016-02-28 MED ORDER — HYDRALAZINE HCL 20 MG/ML IJ SOLN: 5.0000 mg | INTRAMUSCULAR | Status: DC | PRN

## 2016-02-28 MED ORDER — BRIMONIDINE TARTRATE 0.15 % OP SOLN
1.0000 [drp] | Freq: Three times a day (TID) | OPHTHALMIC | Status: DC
  Administered 2016-02-29 (×2): 1 [drp] via OPHTHALMIC
  Filled 2016-02-28: qty 5

## 2016-02-28 MED ORDER — INSULIN ASPART 100 UNIT/ML ~~LOC~~ SOLN
0.0000 [IU] | Freq: Three times a day (TID) | SUBCUTANEOUS | Status: DC
  Administered 2016-02-28 – 2016-02-29 (×3): 3 [IU] via SUBCUTANEOUS
  Administered 2016-02-29: 2 [IU] via SUBCUTANEOUS
  Filled 2016-02-28: qty 1

## 2016-02-28 MED ORDER — SODIUM CHLORIDE 0.9% FLUSH: 3.0000 mL | Freq: Two times a day (BID) | INTRAVENOUS | Status: DC

## 2016-02-28 MED ORDER — SODIUM CHLORIDE 0.9 % IV BOLUS (SEPSIS)
2000.0000 mL | Freq: Once | INTRAVENOUS | Status: AC
  Administered 2016-02-28: 2000 mL via INTRAVENOUS

## 2016-02-28 MED ORDER — ASPIRIN EC 81 MG PO TBEC
81.0000 mg | DELAYED_RELEASE_TABLET | Freq: Every day | ORAL | Status: DC
  Administered 2016-02-28 – 2016-02-29 (×2): 81 mg via ORAL
  Filled 2016-02-28 (×2): qty 1

## 2016-02-28 MED ORDER — SODIUM CHLORIDE 0.9 % IV BOLUS (SEPSIS)
1000.0000 mL | Freq: Once | INTRAVENOUS | Status: AC
  Administered 2016-02-28: 1000 mL via INTRAVENOUS

## 2016-02-28 NOTE — Progress Notes (Signed)
New Admission Note:  Arrival Method: Stretcher  Mental Orientation: Alert and oriented x 4 Telemetry: Box 07 NSR Assessment: Completed Skin: Warm and dry.  IV: NSL  Pain: Denies  Tubes: N/A Safety Measures: Safety Fall Prevention Plan was given, discussed and signed. Admission: Completed [address] Orientation: Patient has been orientated to the room, unit and the staff. Family: Dtr   Orders have been reviewed and implemented. Will continue to monitor the patient. Call light has been placed within reach and bed alarm has been activated.   [place] ShiEmmanuel Sharmeka Palmisano BSN, RN  Phone Number: [phone]

## 2016-02-28 NOTE — ED Notes (Signed)
Admitting at bedside 

## 2016-02-28 NOTE — ED Notes (Signed)
POCT CBG resulted 343; Redmond BasemanHayden, RN present when taken

## 2016-02-28 NOTE — ED Notes (Signed)
Pt arrived to C26 via stretcher, alert. Family members x 2 w/pt. Pt given urinal as requested.

## 2016-02-28 NOTE — H&P (Signed)
History and Physical    [name] [name]:[id number] DOB: [birth year] DOA: 02/28/2016  PCP: Sissy Hoff, MD Patient coming from: home  Chief Complaint: syncope  HPI: [name] [name] is a very pleasant 71 y.o. male with medical history significant for hypertension, hyperlipidemia, diabetes, glaucoma, legally blind presents to the emergency department from his workplace with the chief complaint of syncope. Initial evaluation reveals acute kidney injury, hyponatremia and UTI likely related to dehydration in the setting of uncontrolled diabetes.  Information is obtained from the patient. He reports being in his usual state of health recently except for "being very thirsty" and having a decreased appetite. He denies unintentional weight loss. He denies abdominal pain nausea vomiting diarrhea. He denies fever chills headache dizziness. He denies chest pain palpitation shortness of breath lower extremity edema cough. He denies dysuria hematuria frequency or urgency. He also reports that 6 days ago he experienced uncontrolled high blood sugar came to the emergency department. Chart review indicates at that time his metformin was increased. While at work this morning he reports suddenly feeling very "hot" and then he does not remember passing out. Coworkers indicate his loss of consciousness was brief and patient denies hitting his head. He reports his blood sugar was 210 this morning.   ED Course: In the emergency department he's afebrile hemodynamically stable and not hypoxic. Provided with IV fluids and Keflex.  Review of Systems: As per HPI otherwise 10 point review of systems negative.   Ambulatory Status: He reports using a cane for ambulation due to his vision. He denies any recent falls  Past Medical History:  Diagnosis Date  . Diabetes mellitus without complication (HCC)    Type 2 NIDDM x 1 year  . Glaucoma   . Hyperlipidemia   . Hypertension   . Legally blind   . Syncope     Past Surgical  History:  Procedure Laterality Date  . ANKLE FRACTURE SURGERY Right    with pinning  . CATARACT EXTRACTION W/PHACO Left 06/08/2015   Procedure: CATARACT EXTRACTION PHACO AND INTRAOCULAR LENS PLACEMENT (IOC) LEFT EYE;  Surgeon: Chalmers Guest, MD;  Location: [facility] OR;  Service: Ophthalmology;  Laterality: Left;  . COLONOSCOPY    . EYE SURGERY     Left Eye for Glaucoma  . MINI SHUNT INSERTION Left 01/07/2013   Procedure: MINI TUBE WITH MITOMYCIN C LEFT EYE ;  Surgeon: Chalmers Guest, MD;  Location: [facility] OR;  Service: Ophthalmology;  Laterality: Left;  . TRABECULECTOMY  12/31/2011   Procedure: TRABECULECTOMY;  Surgeon: Chalmers Guest, MD;  Location: [facility] OR;  Service: Ophthalmology;  Laterality: Left;  REVISED OPERATIVE WOUND POST GLAUCOMA SURGERY  . TRABECULECTOMY Left 01/07/2013   Procedure: TRABECULECTOMY;  Surgeon: Chalmers Guest, MD;  Location: [facility] OR;  Service: Ophthalmology;  Laterality: Left;    Social History   Social History  . Marital status: Divorced    Spouse name: N/A  . Number of children: N/A  . Years of education: N/A   Occupational History  . Not on file.   Social History Main Topics  . Smoking status: Never Smoker  . Smokeless tobacco: Not on file  . Alcohol use No  . Drug use: No  . Sexual activity: Not on file   Other Topics Concern  . Not on file   Social History Narrative  . No narrative on file  he works at [employer]. Lives with wife. Non-smoker  No Known Allergies  Family History  Problem Relation Age of  Onset  . Diabetes Mother     Prior to Admission medications   Medication Sig Start Date End Date Taking? Authorizing Provider  amLODipine (NORVASC) 2.5 MG tablet Take 2.5 mg by mouth at bedtime.  04/26/15  Yes Historical Provider, MD  aspirin EC 81 MG tablet Take 81 mg by mouth daily.   Yes Historical Provider, MD  atorvastatin (LIPITOR) 20 MG tablet Take 20 mg by mouth daily. 05/23/15  Yes Historical Provider, MD  brimonidine (ALPHAGAN) 0.15 %  ophthalmic solution Place 1 drop into both eyes 3 (three) times daily.   Yes Historical Provider, MD  lisinopril-hydrochlorothiazide (PRINZIDE,ZESTORETIC) 20-25 MG tablet Take 1 tablet by mouth daily. 11/29/15  Yes Historical Provider, MD  metFORMIN (GLUCOPHAGE) 1000 MG tablet Take 1,000 mg by mouth 2 (two) times daily. 11/29/15  Yes Historical Provider, MD  naproxen sodium (ANAPROX) 220 MG tablet Take 220 mg by mouth 2 (two) times daily as needed (PAIN).   Yes Historical Provider, MD    Physical Exam: Vitals:   02/28/16 1145 02/28/16 1215 02/28/16 1315 02/28/16 1415  BP: 103/74 115/68 125/86 122/82  Pulse: 83 77 72 76  Resp: 16 17 13 17   Temp:      TempSrc:      SpO2: 95% 98% 100% 99%  Weight:      Height:         General:  Appears calm and comfortable Eyes:  PERRL, EOMI, normal lids, iris ENT:  grossly normal hearing, lips & tongue, Pink somewhat dry very poor dentition Neck:  no LAD, masses or thyromegaly Cardiovascular:  RRR, no m/r/g. No LE edema.  Respiratory:  CTA bilaterally, no w/r/r. Normal respiratory effort. Abdomen:  soft, ntnd, positive bowel sounds but somewhat sluggish no guarding or rebounding Skin:  no rash or induration seen on limited exam Musculoskeletal:  grossly normal tone BUE/BLE, good ROM, no bony abnormality Psychiatric:  grossly normal mood and affect, speech fluent and appropriate, AOx3 Neurologic:  CN 2-12 grossly intact, moves all extremities in coordinated fashion, sensation intact speech clear facial symmetry  Labs on Admission: I have personally reviewed following labs and imaging studies  CBC:  Recent Labs Lab 02/28/16 1108  WBC 9.4  HGB 13.8  HCT 40.0  MCV 84.7  PLT 262   Basic Metabolic Panel:  Recent Labs Lab 02/28/16 1108  NA 128*  K 3.9  CL 95*  CO2 21*  GLUCOSE 330*  BUN 31*  CREATININE 1.90*  CALCIUM 9.6   GFR: Estimated Creatinine Clearance: 43.8 mL/min (by C-G formula based on SCr of 1.9 mg/dL (H)). Liver Function  Tests: No results for input(s): AST, ALT, ALKPHOS, BILITOT, PROT, ALBUMIN in the last 168 hours. No results for input(s): LIPASE, AMYLASE in the last 168 hours. No results for input(s): AMMONIA in the last 168 hours. Coagulation Profile: No results for input(s): INR, PROTIME in the last 168 hours. Cardiac Enzymes:  Recent Labs Lab 02/28/16 1220  TROPONINI <0.03   BNP (last 3 results) No results for input(s): PROBNP in the last 8760 hours. HbA1C: No results for input(s): HGBA1C in the last 72 hours. CBG:  Recent Labs Lab 02/28/16 1054 02/28/16 1411  GLUCAP 343* 260*   Lipid Profile: No results for input(s): CHOL, HDL, LDLCALC, TRIG, CHOLHDL, LDLDIRECT in the last 72 hours. Thyroid Function Tests: No results for input(s): TSH, T4TOTAL, FREET4, T3FREE, THYROIDAB in the last 72 hours. Anemia Panel: No results for input(s): VITAMINB12, FOLATE, FERRITIN, TIBC, IRON, RETICCTPCT in the last 72 hours. Urine  analysis:    Component Value Date/Time   COLORURINE YELLOW 02/28/2016 1059   APPEARANCEUR HAZY (A) 02/28/2016 1059   LABSPEC 1.016 02/28/2016 1059   PHURINE 5.0 02/28/2016 1059   GLUCOSEU 150 (A) 02/28/2016 1059   HGBUR NEGATIVE 02/28/2016 1059   BILIRUBINUR NEGATIVE 02/28/2016 1059   KETONESUR NEGATIVE 02/28/2016 1059   PROTEINUR 100 (A) 02/28/2016 1059   UROBILINOGEN 0.2 10/20/2008 2347   NITRITE NEGATIVE 02/28/2016 1059   LEUKOCYTESUR SMALL (A) 02/28/2016 1059    Creatinine Clearance: Estimated Creatinine Clearance: 43.8 mL/min (by C-G formula based on SCr of 1.9 mg/dL (H)).  Sepsis Labs: @LABRCNTIP (procalcitonin:4,lacticidven:4) )No results found for this or any previous visit (from the past 240 hour(s)).   Radiological Exams on Admission: No results found.  EKG: Independently reviewed. Sinus rhythm Short PR interval Abnormal R-wave progression, early transition  Assessment/Plan Principal Problem:   Syncope Active Problems:   UTI (urinary tract  infection)   Hypertension   Diabetes mellitus without complication (HCC)   Hyponatremia   Acute kidney injury (HCC)   #1. Syncope. likely  related to uncontrolled diabetes, UTI dehydration decreased oral intake may be related to a mild gastroparesis in setting of hyperglycemia. Urinalysis consistent with UTI, EKG with no acute changes as noted above, chest x-ray pending. Not orthostatic. His troponin negative. Received 2 L of normal saline in the emergency department as well as Keflex -Admit to telemetry -Obtain a CT of the head -Follow urine culture -Rocephin per pharmacy -IV fluids -Improved glycemic control -Scheduled Zofran to improve oral intake  #2. Diabetes. Serum glucose 330 on admission Home medications include metformin. He reports blood sugars running high lately. May be related to urinary tract infection.  -Hold metformin for now as his appetite is unreliable -IV fluids -Rocephin for UTI -Obtain a hemoglobin A1c -Sized scale insulin for optimal control  3. Urinary tract infection. Likely related to above. Patient denies issues with retention. He is afebrile and nontoxic appearing. CBCs within the limits of normal -Follow urine culture -Gentle IV fluids -Monitor urine output  4. Acute kidney injury. Creatinine 1.9 on admission. Likely related to above. Home medications include lisinopril and hydrochlorothiazide -Hold nephrotoxins -Gentle IV fluids -Monitor urine output -Recheck in the morning -If no improvement consider renal ultrasound  #5. Hypertension. Fair control in the emergency department. Home medications include lisinopril and hydrochlorothiazide as well as Norvasc -Continue Norvasc -Hold lisinopril and hydrochlorothiazide for now secondary to #4 -Monitor closely -When necessary hydralazine  #6. Hyponatremia. Likely related to dehydration the setting of uncontrolled blood sugar. Serum sodium 128 -Gentle IV fluids -Improved glycemic control -Approved oral  intake -Recheck in the morning    DVT prophylaxis: scd Code Status: full  Family Communication: none present  Disposition Plan: home  Consults called: none  Admission status: obs    Toya SmothersBLACK,KAREN M MD [facility]  If 7PM-7AM, please contact night-coverage [email or web address] Password [email or web address]  02/28/2016, 2:55 PM

## 2016-02-28 NOTE — ED Notes (Signed)
Pt given apple sauce, Diet Coke and water as requested. Family at bedside.

## 2016-02-28 NOTE — ED Provider Notes (Signed)
[facility]-EMERGENCY DEPT Provider Note   CSN: [id number] Arrival date & time: 02/28/16 1040     History    Chief Complaint  Patient presents with  . Hyperglycemia     HPI [name] [name] is a 71 y.o. male.  71yo M w/ PMH including T2DM, HTN, HLD who p/w syncope. The patient was at work this morning walking around when he had a sudden onset of feeling hot and according to coworkers he syncopized. His loss of consciousness was brief and he denies any head injury. He denies any chest pain or shortness of breath associated with the event. Patient was evaluated here in the ED on 12/24 for hyperglycemia. His metformin was increased and he was instructed to follow-up with PCP but has not been able to see PCP yet. He has been drinking a lot of water since then but continues to have increased thirst. He was having increased urination but that has improved. He reports some mild weight loss and increased fatigue recently with decreased appetite. No fevers, vomiting, diarrhea, or recent illness. He does endorse nausea. No family history of heart disease. His blood sugar was 210 this morning.   Past Medical History:  Diagnosis Date  . Diabetes mellitus without complication (HCC)    Type 2 NIDDM x 1 year  . Glaucoma   . Hyperlipidemia   . Hypertension   . Syncope      Patient Active Problem List   Diagnosis Date Noted  . Syncope 02/28/2016  . UTI (urinary tract infection) 02/28/2016  . Hyponatremia 02/28/2016  . Hypertension   . Diabetes mellitus without complication [facility])     Past Surgical History:  Procedure Laterality Date  . ANKLE FRACTURE SURGERY Right    with pinning  . CATARACT EXTRACTION W/PHACO Left 06/08/2015   Procedure: CATARACT EXTRACTION PHACO AND INTRAOCULAR LENS PLACEMENT (IOC) LEFT EYE;  Surgeon: Chalmers Guest, MD;  Location: [facility] OR;  Service: Ophthalmology;  Laterality: Left;  . COLONOSCOPY    . EYE SURGERY     Left Eye for Glaucoma  . MINI SHUNT INSERTION Left 01/07/2013     Procedure: MINI TUBE WITH MITOMYCIN C LEFT EYE ;  Surgeon: Chalmers Guest, MD;  Location: [facility] OR;  Service: Ophthalmology;  Laterality: Left;  . TRABECULECTOMY  12/31/2011   Procedure: TRABECULECTOMY;  Surgeon: Chalmers Guest, MD;  Location: [facility] OR;  Service: Ophthalmology;  Laterality: Left;  REVISED OPERATIVE WOUND POST GLAUCOMA SURGERY  . TRABECULECTOMY Left 01/07/2013   Procedure: TRABECULECTOMY;  Surgeon: Chalmers Guest, MD;  Location: [facility] OR;  Service: Ophthalmology;  Laterality: Left;        Home Medications    Prior to Admission medications   Medication Sig Start Date End Date Taking? Authorizing Provider  amLODipine (NORVASC) 2.5 MG tablet Take 2.5 mg by mouth at bedtime.  04/26/15  Yes Historical Provider, MD  aspirin EC 81 MG tablet Take 81 mg by mouth daily.   Yes Historical Provider, MD  atorvastatin (LIPITOR) 20 MG tablet Take 20 mg by mouth daily. 05/23/15  Yes Historical Provider, MD  brimonidine (ALPHAGAN) 0.15 % ophthalmic solution Place 1 drop into both eyes 3 (three) times daily.   Yes Historical Provider, MD  lisinopril-hydrochlorothiazide (PRINZIDE,ZESTORETIC) 20-25 MG tablet Take 1 tablet by mouth daily. 11/29/15  Yes Historical Provider, MD  metFORMIN (GLUCOPHAGE) 1000 MG tablet Take 1,000 mg by mouth 2 (two) times daily. 11/29/15  Yes Historical Provider, MD  naproxen sodium (ANAPROX) 220 MG tablet Take 220 mg by mouth 2 (  two) times daily as needed (PAIN).   Yes Historical Provider, MD      No family history on file.   Social History  Substance Use Topics  . Smoking status: Never Smoker  . Smokeless tobacco: Not on file  . Alcohol use No     Allergies     Patient has no known allergies.    Review of Systems  10 Systems reviewed and are negative for acute change except as noted in the HPI.   Physical Exam Updated Vital Signs BP 138/71 (BP Location: Right Arm)   Pulse 101   Temp 98.4 F (36.9 C) (Oral)   Resp 20   Ht 5' 10.5" (1.791 m)   Wt 220 lb  (99.8 kg)   SpO2 97%   BMI 31.12 kg/m   Physical Exam  Constitutional: He is oriented to person, place, and time. He appears well-developed and well-nourished. No distress.  HENT:  Head: Normocephalic and atraumatic.  Moist mucous membranes  Eyes: Conjunctivae are normal. Pupils are equal, round, and reactive to light.  Neck: Neck supple.  Cardiovascular: Normal rate, regular rhythm and normal heart sounds.   No murmur heard. Pulmonary/Chest: Effort normal and breath sounds normal.  Abdominal: Soft. Bowel sounds are normal. He exhibits no distension. There is no tenderness.  Musculoskeletal: He exhibits no edema.  Neurological: He is alert and oriented to person, place, and time.  Fluent speech  Skin: Skin is warm and dry.  Psychiatric: He has a normal mood and affect. Judgment normal.  Nursing note and vitals reviewed.     ED Treatments / Results  Labs (all labs ordered are listed, but only abnormal results are displayed) Labs Reviewed  BASIC METABOLIC PANEL - Abnormal; Notable for the following:       Result Value   Sodium 128 (*)    Chloride 95 (*)    CO2 21 (*)    Glucose, Bld 330 (*)    BUN 31 (*)    Creatinine, Ser 1.90 (*)    GFR calc non Af Amer 34 (*)    GFR calc Af Amer 40 (*)    All other components within normal limits  URINALYSIS, ROUTINE W REFLEX MICROSCOPIC - Abnormal; Notable for the following:    APPearance HAZY (*)    Glucose, UA 150 (*)    Protein, ur 100 (*)    Leukocytes, UA SMALL (*)    Bacteria, UA MANY (*)    Squamous Epithelial / LPF 0-5 (*)    All other components within normal limits  CBG MONITORING, ED - Abnormal; Notable for the following:    Glucose-Capillary 343 (*)    All other components within normal limits  URINE CULTURE  CBC  TROPONIN I  CBG MONITORING, ED     EKG  EKG Interpretation  Date/Time:  Tuesday February 28 2016 13:35:10 EST Ventricular Rate:  85 PR Interval:    QRS Duration: 100 QT Interval:  370 QTC  Calculation: 440 R Axis:   32 Text Interpretation:  Sinus rhythm Short PR interval Abnormal R-wave progression, early transition No significant change since last tracing Confirmed by Manus Weedman MD, Quintavius Niebuhr ([id number]) on 02/28/2016 1:49:37 PM         Radiology No results found.  Procedures Procedures (including critical care time) Procedures  Medications Ordered in ED  Medications  cephALEXin (KEFLEX) capsule 500 mg (not administered)  sodium chloride 0.9 % bolus 2,000 mL (2,000 mLs Intravenous New Bag/Given 02/28/16 1221)  Initial Impression / Assessment and Plan / ED Course  I have reviewed the triage vital signs and the nursing notes.  Pertinent labs that were available during my care of the patient were reviewed by me and considered in my medical decision making (see chart for details).  Clinical Course    Pt presents after a syncopal episode at work today in the setting of hyperglycemia despite being compliant with metformin. He was awake and comfortable on exam. EKG unremarkable. No neurologic symptoms. His labs show glucose of 330, creatinine of 1.9 which is even higher than last time. Normal CBC and troponin. His UA does show many white blood cells and bacteria, added urine culture and gave dose of Keflex as infection may be contributing to his poor glucose control. Gave 2 L of IV fluids. Given his age and comorbidities, I feel he needs workup for his syncopal episode and I'm also concerned about his worsening AK I and the setting of poor blood sugar control. Discussed with Clydie Braun, [facility], appreciate her assistance. Pt admitted to Dr. Satira Sark service for further evaluation.   Final Clinical Impressions(s) / ED Diagnoses   Final diagnoses:  Syncope and collapse  AKI (acute kidney injury) (HCC)  Urinary tract infection without hematuria, site unspecified  Hyperglycemia     New Prescriptions   No medications on file       Laurence Spates, MD 02/28/16 1407

## 2016-02-28 NOTE — ED Triage Notes (Signed)
Pt presents for evaluation of hyperglycemia and syncopal episode this AM while at work. Pt. Wife reports CBG was 210 prior to leaving for work and pt felt at baseline. Pt reports increased fatigue recently. Pt. Had episode hyperglycemia 12/24 and followed up with PCP with insulin dose change. Pt AxO x4 on arrival.

## 2016-02-29 ENCOUNTER — Observation Stay (HOSPITAL_BASED_OUTPATIENT_CLINIC_OR_DEPARTMENT_OTHER): Payer: Medicare Other

## 2016-02-29 DIAGNOSIS — R55 Syncope and collapse: Secondary | ICD-10-CM | POA: Diagnosis not present

## 2016-02-29 LAB — ECHOCARDIOGRAM COMPLETE
HEIGHTINCHES: 70 in
WEIGHTICAEL: 3724.8 [oz_av]

## 2016-02-29 LAB — BASIC METABOLIC PANEL
Anion gap: 7 (ref 5–15)
Anion gap: 7 (ref 5–15)
BUN: 17 mg/dL (ref 6–20)
BUN: 16 mg/dL (ref 6–20)
CALCIUM: 8.8 mg/dL — AB (ref 8.9–10.3)
CHLORIDE: 102 mmol/L (ref 101–111)
CO2: 26 mmol/L (ref 22–32)
CO2: 26 mmol/L (ref 22–32)
CREATININE: 1.14 mg/dL (ref 0.61–1.24)
CREATININE: 1.16 mg/dL (ref 0.61–1.24)
Calcium: 8.8 mg/dL — ABNORMAL LOW (ref 8.9–10.3)
Chloride: 103 mmol/L (ref 101–111)
GFR calc Af Amer: >60 mL/min (ref >60)
GFR calc Af Amer: >60 mL/min (ref >60)
GFR calc non Af Amer: >60 mL/min (ref >60)
GFR calc non Af Amer: >60 mL/min (ref >60)
GLUCOSE: 199 mg/dL — AB (ref 65–99)
GLUCOSE: 214 mg/dL — AB (ref 65–99)
POTASSIUM: 3.6 mmol/L (ref 3.5–5.1)
Potassium: 3.6 mmol/L (ref 3.5–5.1)
SODIUM: 136 mmol/L (ref 135–145)
Sodium: 135 mmol/L (ref 135–145)

## 2016-02-29 LAB — GLUCOSE, CAPILLARY
GLUCOSE-CAPILLARY: 233 mg/dL — AB (ref 65–99)
GLUCOSE-CAPILLARY: 233 mg/dL — AB (ref 65–99)
GLUCOSE-CAPILLARY: 161 mg/dL — AB (ref 65–99)

## 2016-02-29 LAB — CBC
HCT: 34 % — ABNORMAL LOW (ref 39.0–52.0)
HEMOGLOBIN: 12.2 g/dL — AB (ref 13.0–17.0)
MCH: 29.8 pg (ref 26.0–34.0)
MCHC: 35.9 g/dL (ref 30.0–36.0)
MCV: 82.9 fL (ref 78.0–100.0)
Platelets: 258 10*3/uL (ref 150–400)
RBC: 4.1 MIL/uL — ABNORMAL LOW (ref 4.22–5.81)
RDW: 12.9 % (ref 11.5–15.5)
WBC: 6 10*3/uL (ref 4.0–10.5)

## 2016-02-29 MED ORDER — CEFDINIR 300 MG PO CAPS: 300.0000 mg | ORAL_CAPSULE | Freq: Two times a day (BID) | ORAL | 0 refills | Status: AC

## 2016-02-29 NOTE — Progress Notes (Signed)
Inpatient Diabetes Program Recommendations  AACE/ADA: New Consensus Statement on Inpatient Glycemic Control (2015)  Target Ranges:  Prepandial:   less than 140 mg/dL      Peak postprandial:   less than 180 mg/dL (1-2 hours)      Critically ill patients:  140 - 180 mg/dL   Review of Glycemic Control  Diabetes history: DM 2 Outpatient Diabetes medications: Metformin 1000 mg BID (recently increased) Current orders for Inpatient glycemic control: Novolog Sensitive TID + HS scale  Inpatient Diabetes Program Recommendations:   Based on Metformin dose and elevated glucose trend, please consider increasing correction to Novolog Moderate Correction TID.  Thanks,  Christena DeemShannon Dorse Locy RN, MSN, [facility] Inpatient Diabetes Coordinator Team Pager [phone] (8a-5p)

## 2016-02-29 NOTE — Progress Notes (Signed)
I received a call from central telemetry stating that the patients HR was 26. When I checked on the patient, he was asleep, aroused easily, and had no further symptoms or complaints. MD notified. Vega MD stated to just continue to monitor the patient. Will continue to monitor.

## 2016-02-29 NOTE — Progress Notes (Signed)
Pt. Requested note for work. MD notified. Note obtained and given to patient.

## 2016-02-29 NOTE — Discharge Summary (Addendum)
Physician Discharge Summary  [name] [name] DOB: 03/12/1945 DOA: 02/28/2016  PCP: Sissy HoffSWAYNE,DAVID W, MD  Admit date: 02/28/2016 Discharge date: 02/29/2016  Time spent: > 35 minutes  Recommendations for Outpatient Follow-up:  1. Monitor blood sugars and consider increasing hypoglycemic agents pending blood sugar values 2. F/u with hgba1c 3. Decide when to continue antihypertensive medications 4. Reassess bmp in 1 week post d/c   Discharge Diagnoses:  Principal Problem:   Syncope Active Problems:   UTI (urinary tract infection)   Hypertension   Diabetes mellitus without complication (HCC)   Hyponatremia   Acute kidney injury (HCC)   Diabetes mellitus with complication [facility](HCC)   Discharge Condition: stable  Diet recommendation: diabetic diet  Filed Weights   02/28/16 1056 02/28/16 2035  Weight: 99.8 kg (220 lb) 105.6 kg (232 lb 12.8 oz)    History of present illness:  [name] HopesCarl L Lukic is a 71 y.o. male who presents with syncope likely from uncontrolled DM and dehydration from mild gastro in the settin gof a UTI.   Hospital Course:  UTI - improving on 3rd generation cephalosporin. Will d/c on omnicef however recommend following up with hospital urine culture. - Will treat for 7 days of therapy  Syncope - was most likely due to dehydration.  - Orthostatic negative on last check  DM - d/c on metformin - recommend daily blood sugar monitoring and adjustment of hypoglycemic agents pending values - will have secretary set up f/u appointment with pcp on d/c  For other medically known conditions listed above will continue home medication regimen. Will hold lisinopril-hctz on d/c. Defer to pcp when to continue  Procedures:  None  Consultations:  None  Discharge Exam: Vitals:   02/29/16 0512 02/29/16 0945  BP: 130/81 127/78  Pulse: 69 68  Resp: 18 18  Temp: 98.6 F (37 C) 98.5 F (36.9 C)    General: Pt in nad, alert and awake Cardiovascular: rrr, no  rubs Respiratory: no increase wob, no wheezes  Discharge Instructions   Discharge Instructions    Call MD for:  severe uncontrolled pain    Complete by:  As directed    Call MD for:  temperature >100.4    Complete by:  As directed    Diet - low sodium heart healthy    Complete by:  As directed    Discharge instructions    Complete by:  As directed    Follow up with urine culture results and tailor antibiotics should patient require it.  Will hold antihypertensive medication on discharge given normal values off of blood pressure medications.   Increase activity slowly    Complete by:  As directed      Current Discharge Medication List    START taking these medications   Details  cefdinir (OMNICEF) 300 MG capsule Take 1 capsule (300 mg total) by mouth 2 (two) times daily. Qty: 12 capsule, Refills: 0      CONTINUE these medications which have NOT CHANGED   Details  amLODipine (NORVASC) 2.5 MG tablet Take 2.5 mg by mouth at bedtime.  Refills: 0    aspirin EC 81 MG tablet Take 81 mg by mouth daily.    atorvastatin (LIPITOR) 20 MG tablet Take 20 mg by mouth daily.    brimonidine (ALPHAGAN) 0.15 % ophthalmic solution Place 1 drop into both eyes 3 (three) times daily.    metFORMIN (GLUCOPHAGE) 1000 MG tablet Take 1,000 mg by mouth 2 (two) times daily.  STOP taking these medications     lisinopril-hydrochlorothiazide (PRINZIDE,ZESTORETIC) 20-25 MG tablet      naproxen sodium (ANAPROX) 220 MG tablet        No Known Allergies    The results of significant diagnostics from this hospitalization (including imaging, microbiology, ancillary and laboratory) are listed below for reference.    Significant Diagnostic Studies: X-ray Chest Pa And Lateral  Result Date: 02/28/2016 CLINICAL DATA:  Syncope and dry cough EXAM: CHEST  2 VIEW COMPARISON:  01/06/2013 FINDINGS: No acute infiltrate or effusion. Borderline heart size. Stable tortuous aorta. No pneumothorax. IMPRESSION:  No radiographic evidence for acute cardiopulmonary abnormality. Electronically Signed   By: Jasmine Pang M.D.   On: 02/28/2016 23:05   Ct Head Wo Contrast  Result Date: 02/28/2016 CLINICAL DATA:  Hyperglycemia and syncope. EXAM: CT HEAD WITHOUT CONTRAST TECHNIQUE: Contiguous axial images were obtained from the base of the skull through the vertex without intravenous contrast. COMPARISON:  None. FINDINGS: Brain: There is minimal periventricular white matter hypodensity consistent with small vessel ischemia. No acute large vascular territory infarction, mass or extra-axial fluid. No midline shift or edema. Vascular: Carotid siphon calcifications noted bilaterally. Dense vertebral artery calcifications are seen bilaterally as well. No hyperdense vessels. Skull: Nonacute Sinuses/Orbits: Left ocular surgical change. No retrobulbar abnormalities. Mild ethmoid sinus mucosal thickening. Mastoids are clear. The sphenoid, visualized maxillary and frontal sinuses are unremarkable. 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 No acute intracranial abnormality noted. Electronically Signed   By: Tollie Eth M.D.   On: 02/28/2016 16:01    Microbiology: No results found for this or any previous visit (from the past 240 hour(s)).   Labs: Basic Metabolic Panel:  Recent Labs Lab 02/28/16 1108 02/29/16 0535 02/29/16 0809  NA 128* 135 136  K 3.9 3.6 3.6  CL 95* 102 103  CO2 21* 26 26  GLUCOSE 330* 199* 214*  BUN 31* 17 16  CREATININE 1.90* 1.14 1.16  CALCIUM 9.6 8.8* 8.8*   Liver Function Tests: No results for input(s): AST, ALT, ALKPHOS, BILITOT, PROT, ALBUMIN in the last 168 hours. No results for input(s): LIPASE, AMYLASE in the last 168 hours. No results for input(s): AMMONIA in the last 168 hours. CBC:  Recent Labs Lab 02/28/16 1108 02/29/16 0535  WBC 9.4 6.0  HGB 13.8 12.2*  HCT 40.0 34.0*  MCV 84.7 82.9  PLT 262 258   Cardiac  Enzymes:  Recent Labs Lab 02/28/16 1220  TROPONINI <0.03   BNP: BNP (last 3 results) No results for input(s): BNP in the last 8760 hours.  ProBNP (last 3 results) No results for input(s): PROBNP in the last 8760 hours.  CBG:  Recent Labs Lab 02/28/16 1411 02/28/16 1816 02/28/16 2032 02/29/16 0831 02/29/16 1207  GLUCAP 260* 245* 180* 233* 233*    Signed:  Penny Pia MD.  [facility] 02/29/2016, 1:15 PM

## 2016-02-29 NOTE — Evaluation (Signed)
Physical Therapy Evaluation Patient Details Name: [name] HopesCarl L Gault MRN: [id number] DOB: 07/20/1945 Today's Date: 02/29/2016   History of Present Illness  Pt is a 71 y/o male with PMH of DM, HTN, HLD, and glaucoma admitted with syncope and brief LOC.    Clinical Impression  Pt tolerated session well.  No evidence of imbalance or muscle weakness in LEs during mobility assessment.  No further PT indicated in hospital or as f/u.     Follow Up Recommendations No PT follow up    Equipment Recommendations  None recommended by PT    Recommendations for Other Services       Precautions / Restrictions Restrictions Weight Bearing Restrictions: No      Mobility  Bed Mobility Overal bed mobility: Independent                Transfers Overall transfer level: Independent Equipment used: None             General transfer comment: Sit<>stand and pivots without LOB  Ambulation/Gait Ambulation/Gait assistance: Supervision (due to visual deficits as pt does not have his glasses here) Ambulation Distance (Feet): 30 Feet Assistive device: None Gait Pattern/deviations: WFL(Within Functional Limits)   Gait velocity interpretation: at or above normal speed for age/gender    Stairs            Wheelchair Mobility    Modified Rankin (Stroke Patients Only)       Balance Overall balance assessment: Independent                               Standardized Balance Assessment Standardized Balance Assessment :  (5X Sit<>Stand 6.9 seconds)           Pertinent Vitals/Pain Pain Assessment: No/denies pain    Home Living Family/patient expects to be discharged to:: Private residence Living Arrangements: Spouse/significant other Available Help at Discharge: Available PRN/intermittently Type of Home: House         Home Equipment: None      Prior Function Level of Independence: Independent         Comments: Working FT, driving.     Hand Dominance       Extremity/Trunk Assessment   Upper Extremity Assessment Upper Extremity Assessment: Defer to OT evaluation    Lower Extremity Assessment Lower Extremity Assessment: Overall WFL for tasks assessed    Cervical / Trunk Assessment Cervical / Trunk Assessment: Normal  Communication   Communication: No difficulties  Cognition Arousal/Alertness: Awake/alert Behavior During Therapy: WFL for tasks assessed/performed Overall Cognitive Status: Within Functional Limits for tasks assessed                      General Comments      Exercises     Assessment/Plan    PT Assessment Patent does not need any further PT services  PT Problem List            PT Treatment Interventions      PT Goals (Current goals can be found in the Care Plan section)  Acute Rehab PT Goals Patient Stated Goal: to go back to work tomorrow PT Goal Formulation: With patient Time For Goal Achievement: 02/29/16 Potential to Achieve Goals: Good    Frequency     Barriers to discharge        Co-evaluation               End of Session   Activity  Tolerance: Patient tolerated treatment well Patient left: in bed;with call bell/phone within reach Nurse Communication: Mobility status    Functional Limitation: Mobility: Walking and moving around Mobility: Walking and Moving Around Current Status (Z6109): At least 1 percent but less than 20 percent impaired, limited or restricted Mobility: Walking and Moving Around Goal Status [phone]): At least 1 percent but less than 20 percent impaired, limited or restricted Mobility: Walking and Moving Around Discharge Status [phone]): At least 1 percent but less than 20 percent impaired, limited or restricted    Time: 1445-1455 PT Time Calculation (min) (ACUTE ONLY): 10 min   Charges:   PT Evaluation $PT Eval Low Complexity: 1 Procedure     PT G Codes:   PT G-Codes **NOT FOR INPATIENT CLASS** Functional Limitation: Mobility: Walking and moving  around Mobility: Walking and Moving Around Current Status (B1478): At least 1 percent but less than 20 percent impaired, limited or restricted Mobility: Walking and Moving Around Goal Status [phone]): At least 1 percent but less than 20 percent impaired, limited or restricted Mobility: Walking and Moving Around Discharge Status [phone]): At least 1 percent but less than 20 percent impaired, limited or restricted    Emelin Dascenzo E Penven-Crew 02/29/2016, 3:28 PM

## 2016-02-29 NOTE — Care Management Obs Status (Signed)
MEDICARE OBSERVATION STATUS NOTIFICATION   Patient Details  Name: [name] [name] MRN: [id number] Date of Birth: 10/29/1945   Medicare Observation Status Notification Given:  Yes    Raegan Winders, Annamarie MajorCheryl U, RN 02/29/2016, 11:43 AM

## 2016-02-29 NOTE — Progress Notes (Signed)
[name] [name] to be D/C'd Home per MD order.  Discussed prescriptions and follow up appointments with the patient. Prescriptions given to patient, medication list explained in detail. Pt verbalized understanding.  Allergies as of 02/29/2016   No Known Allergies     Medication List    STOP taking these medications   lisinopril-hydrochlorothiazide 20-25 MG tablet Commonly known as:  PRINZIDE,ZESTORETIC   naproxen sodium 220 MG tablet Commonly known as:  ANAPROX     TAKE these medications   amLODipine 2.5 MG tablet Commonly known as:  NORVASC Take 2.5 mg by mouth at bedtime.   aspirin EC 81 MG tablet Take 81 mg by mouth daily.   atorvastatin 20 MG tablet Commonly known as:  LIPITOR Take 20 mg by mouth daily.   brimonidine 0.15 % ophthalmic solution Commonly known as:  ALPHAGAN Place 1 drop into both eyes 3 (three) times daily.   cefdinir 300 MG capsule Commonly known as:  OMNICEF Take 1 capsule (300 mg total) by mouth 2 (two) times daily.   metFORMIN 1000 MG tablet Commonly known as:  GLUCOPHAGE Take 1,000 mg by mouth 2 (two) times daily.       Vitals:   02/29/16 0512 02/29/16 0945  BP: 130/81 127/78  Pulse: 69 68  Resp: 18 18  Temp: 98.6 F (37 C) 98.5 F (36.9 C)    Skin clean, dry and intact without evidence of skin break down, no evidence of skin tears noted. IV catheter discontinued intact. Site without signs and symptoms of complications. Dressing and pressure applied. Pt denies pain at this time. No complaints noted.  An After Visit Summary was printed and given to the patient. Patient escorted via WC, and D/C home via private auto.  Britt BologneseAnisha Mabe RN, BSN

## 2016-03-02 LAB — URINE CULTURE: SPECIAL REQUESTS: NORMAL

## 2016-03-02 LAB — HEMOGLOBIN A1C: Mean Plasma Glucose: >398 mg/dL

## 2019-04-14 ENCOUNTER — Ambulatory Visit: Payer: Medicare Other | Attending: Internal Medicine

## 2019-04-14 DIAGNOSIS — Z23 Encounter for immunization: Secondary | ICD-10-CM | POA: Insufficient documentation

## 2019-04-14 NOTE — Progress Notes (Signed)
   Covid-19 Vaccination Clinic  Name:  [name] [name]    MRN: [id number] DOB: 1945/11/09  04/14/2019  Mr. Eriksson was observed post Covid-19 immunization for 15 minutes without incidence. He was provided with Vaccine Information Sheet and instruction to access the V-Safe system.   Mr. Mulvehill was instructed to call 911 with any severe reactions post vaccine: Marland Kitchen Difficulty breathing  . Swelling of your face and throat  . A fast heartbeat  . A bad rash all over your body  . Dizziness and weakness    Immunizations Administered    Name Date Dose VIS Date Route   Moderna COVID-19 Vaccine 04/14/2019 11:57 AM 0.5 mL 01/27/2019 Intramuscular   Manufacturer: Moderna   Lot: [id number]   NDC: 91225-834-62

## 2019-04-23 ENCOUNTER — Inpatient Hospital Stay (HOSPITAL_COMMUNITY)
Admission: EM | Admit: 2019-04-23 | Discharge: 2019-04-27 | DRG: 177 | Disposition: A | Discharge status: Home Health | Payer: Medicare Other | Attending: Internal Medicine | Admitting: Internal Medicine

## 2019-04-23 ENCOUNTER — Encounter (HOSPITAL_COMMUNITY): Payer: Self-pay | Admitting: Internal Medicine

## 2019-04-23 ENCOUNTER — Other Ambulatory Visit: Payer: Self-pay

## 2019-04-23 ENCOUNTER — Emergency Department (HOSPITAL_COMMUNITY): Payer: Medicare Other

## 2019-04-23 DIAGNOSIS — Z7984 Long term (current) use of oral hypoglycemic drugs: Secondary | ICD-10-CM

## 2019-04-23 DIAGNOSIS — H409 Unspecified glaucoma: Secondary | ICD-10-CM | POA: Diagnosis present

## 2019-04-23 DIAGNOSIS — J1282 Pneumonia due to coronavirus disease 2019: Secondary | ICD-10-CM | POA: Diagnosis present

## 2019-04-23 DIAGNOSIS — J9601 Acute respiratory failure with hypoxia: Secondary | ICD-10-CM | POA: Diagnosis present

## 2019-04-23 DIAGNOSIS — Z833 Family history of diabetes mellitus: Secondary | ICD-10-CM | POA: Diagnosis not present

## 2019-04-23 DIAGNOSIS — H548 Legal blindness, as defined in USA: Secondary | ICD-10-CM | POA: Diagnosis present

## 2019-04-23 DIAGNOSIS — N179 Acute kidney failure, unspecified: Secondary | ICD-10-CM | POA: Diagnosis present

## 2019-04-23 DIAGNOSIS — I10 Essential (primary) hypertension: Secondary | ICD-10-CM | POA: Diagnosis present

## 2019-04-23 DIAGNOSIS — U071 COVID-19: Principal | ICD-10-CM | POA: Diagnosis present

## 2019-04-23 DIAGNOSIS — E785 Hyperlipidemia, unspecified: Secondary | ICD-10-CM | POA: Diagnosis present

## 2019-04-23 DIAGNOSIS — E119 Type 2 diabetes mellitus without complications: Secondary | ICD-10-CM | POA: Diagnosis present

## 2019-04-23 DIAGNOSIS — J9600 Acute respiratory failure, unspecified whether with hypoxia or hypercapnia: Secondary | ICD-10-CM | POA: Diagnosis not present

## 2019-04-23 DIAGNOSIS — R0602 Shortness of breath: Secondary | ICD-10-CM | POA: Diagnosis present

## 2019-04-23 LAB — CBC
HCT: 43.8 % (ref 39.0–52.0)
HCT: 40.3 % (ref 39.0–52.0)
Hemoglobin: 14.4 g/dL (ref 13.0–17.0)
Hemoglobin: 13.7 g/dL (ref 13.0–17.0)
MCH: 29.3 pg (ref 26.0–34.0)
MCH: 30.2 pg (ref 26.0–34.0)
MCHC: 32.9 g/dL (ref 30.0–36.0)
MCHC: 34 g/dL (ref 30.0–36.0)
MCV: 89.2 fL (ref 80.0–100.0)
MCV: 88.8 fL (ref 80.0–100.0)
Platelets: 241 10*3/uL (ref 150–400)
Platelets: 232 10*3/uL (ref 150–400)
RBC: 4.91 MIL/uL (ref 4.22–5.81)
RBC: 4.54 MIL/uL (ref 4.22–5.81)
RDW: 13.3 % (ref 11.5–15.5)
RDW: 13.3 % (ref 11.5–15.5)
WBC: 6.9 10*3/uL (ref 4.0–10.5)
WBC: 6.2 10*3/uL (ref 4.0–10.5)
nRBC: 0 % (ref 0.0–0.2)
nRBC: 0 % (ref 0.0–0.2)

## 2019-04-23 LAB — BASIC METABOLIC PANEL
Anion gap: 11 (ref 5–15)
BUN: 23 mg/dL (ref 8–23)
CO2: 21 mmol/L — ABNORMAL LOW (ref 22–32)
Calcium: 8.8 mg/dL — ABNORMAL LOW (ref 8.9–10.3)
Chloride: 106 mmol/L (ref 98–111)
Creatinine, Ser: 1.83 mg/dL — ABNORMAL HIGH (ref 0.61–1.24)
GFR calc Af Amer: 42 mL/min — ABNORMAL LOW (ref >60)
GFR calc non Af Amer: 36 mL/min — ABNORMAL LOW (ref >60)
Glucose, Bld: 179 mg/dL — ABNORMAL HIGH (ref 70–99)
Potassium: 3.9 mmol/L (ref 3.5–5.1)
Sodium: 138 mmol/L (ref 135–145)

## 2019-04-23 LAB — POC SARS CORONAVIRUS 2 AG -  ED: SARS Coronavirus 2 Ag: NEGATIVE

## 2019-04-23 LAB — TROPONIN I (HIGH SENSITIVITY): Troponin I (High Sensitivity): 20 ng/L — ABNORMAL HIGH (ref <18)

## 2019-04-23 LAB — CREATININE, SERUM
Creatinine, Ser: 1.82 mg/dL — ABNORMAL HIGH (ref 0.61–1.24)
GFR calc Af Amer: 42 mL/min — ABNORMAL LOW (ref >60)
GFR calc non Af Amer: 36 mL/min — ABNORMAL LOW (ref >60)

## 2019-04-23 LAB — URINALYSIS, ROUTINE W REFLEX MICROSCOPIC
Bilirubin Urine: NEGATIVE
Glucose, UA: 50 mg/dL — AB
Ketones, ur: NEGATIVE mg/dL
Leukocytes,Ua: NEGATIVE
Nitrite: NEGATIVE
Protein, ur: >=300 mg/dL — AB
Specific Gravity, Urine: 1.027 (ref 1.005–1.030)
pH: 5 (ref 5.0–8.0)

## 2019-04-23 LAB — FERRITIN: Ferritin: 577 ng/mL — ABNORMAL HIGH (ref 24–336)

## 2019-04-23 LAB — C-REACTIVE PROTEIN: CRP: 9.1 mg/dL — ABNORMAL HIGH (ref <1.0)

## 2019-04-23 LAB — D-DIMER, QUANTITATIVE: D-Dimer, Quant: 0.95 ug/mL-FEU — ABNORMAL HIGH (ref 0.00–0.50)

## 2019-04-23 LAB — RESPIRATORY PANEL BY RT PCR (FLU A&B, COVID)
Influenza A by PCR: NEGATIVE
Influenza B by PCR: NEGATIVE
SARS Coronavirus 2 by RT PCR: POSITIVE — AB

## 2019-04-23 LAB — CBG MONITORING, ED: Glucose-Capillary: 163 mg/dL — ABNORMAL HIGH (ref 70–99)

## 2019-04-23 LAB — BRAIN NATRIURETIC PEPTIDE: B Natriuretic Peptide: 60 pg/mL (ref 0.0–100.0)

## 2019-04-23 MED ORDER — ACETAMINOPHEN 325 MG PO TABS: 650.0000 mg | ORAL_TABLET | Freq: Four times a day (QID) | ORAL | Status: DC | PRN

## 2019-04-23 MED ORDER — LATANOPROST 0.005 % OP SOLN
1.0000 [drp] | Freq: Every day | OPHTHALMIC | Status: DC
  Administered 2019-04-24 – 2019-04-27 (×4): 1 [drp] via OPHTHALMIC
  Filled 2019-04-23: qty 2.5

## 2019-04-23 MED ORDER — ACETAMINOPHEN 325 MG PO TABS
650.0000 mg | ORAL_TABLET | Freq: Once | ORAL | Status: AC
Start: 2019-04-23 — End: 2019-04-23
  Administered 2019-04-23: 17:00:00 650 mg via ORAL
  Filled 2019-04-23: qty 2

## 2019-04-23 MED ORDER — DEXAMETHASONE SODIUM PHOSPHATE 10 MG/ML IJ SOLN
6.0000 mg | INTRAMUSCULAR | Status: DC
  Administered 2019-04-23 – 2019-04-25 (×3): 6 mg via INTRAVENOUS
  Filled 2019-04-23 (×3): qty 1

## 2019-04-23 MED ORDER — AMLODIPINE BESYLATE 10 MG PO TABS
10.0000 mg | ORAL_TABLET | Freq: Every day | ORAL | Status: DC
  Administered 2019-04-24 – 2019-04-27 (×4): 10 mg via ORAL
  Filled 2019-04-23 (×4): qty 1

## 2019-04-23 MED ORDER — ENOXAPARIN SODIUM 40 MG/0.4ML ~~LOC~~ SOLN
40.0000 mg | SUBCUTANEOUS | Status: DC
  Administered 2019-04-23 – 2019-04-26 (×4): 40 mg via SUBCUTANEOUS
  Filled 2019-04-23 (×4): qty 0.4

## 2019-04-23 MED ORDER — ACETAMINOPHEN 650 MG RE SUPP: 650.0000 mg | Freq: Four times a day (QID) | RECTAL | Status: DC | PRN

## 2019-04-23 MED ORDER — ATORVASTATIN CALCIUM 10 MG PO TABS
20.0000 mg | ORAL_TABLET | Freq: Every day | ORAL | Status: DC
  Administered 2019-04-24 – 2019-04-27 (×4): 20 mg via ORAL
  Filled 2019-04-23 (×4): qty 2

## 2019-04-23 MED ORDER — SODIUM CHLORIDE 0.9 % IV SOLN
100.0000 mg | Freq: Every day | INTRAVENOUS | Status: AC
  Administered 2019-04-24 – 2019-04-27 (×4): 100 mg via INTRAVENOUS
  Filled 2019-04-23 (×5): qty 20

## 2019-04-23 MED ORDER — INSULIN ASPART 100 UNIT/ML ~~LOC~~ SOLN
0.0000 [IU] | Freq: Three times a day (TID) | SUBCUTANEOUS | Status: DC
  Administered 2019-04-24 (×2): 3 [IU] via SUBCUTANEOUS
  Administered 2019-04-24: 17:00:00 2 [IU] via SUBCUTANEOUS
  Administered 2019-04-25: 3 [IU] via SUBCUTANEOUS
  Administered 2019-04-25: 5 [IU] via SUBCUTANEOUS
  Administered 2019-04-25 – 2019-04-26 (×2): 3 [IU] via SUBCUTANEOUS
  Administered 2019-04-26: 09:00:00 5 [IU] via SUBCUTANEOUS

## 2019-04-23 MED ORDER — SODIUM CHLORIDE 0.9 % IV SOLN
200.0000 mg | Freq: Once | INTRAVENOUS | Status: AC
  Administered 2019-04-23: 23:00:00 200 mg via INTRAVENOUS
  Filled 2019-04-23: qty 40

## 2019-04-23 MED ORDER — ONDANSETRON HCL 4 MG/2ML IJ SOLN: 4.0000 mg | Freq: Four times a day (QID) | INTRAMUSCULAR | Status: DC | PRN

## 2019-04-23 MED ORDER — ONDANSETRON HCL 4 MG PO TABS: 4.0000 mg | ORAL_TABLET | Freq: Four times a day (QID) | ORAL | Status: DC | PRN

## 2019-04-23 NOTE — ED Provider Notes (Signed)
[facility] EMERGENCY DEPARTMENT Provider Note   CSN: [id number] Arrival date & time: 04/23/19  1507     History Chief Complaint  Patient presents with  . Shortness of Breath  . Weakness  . Anorexia    [name] [name] is a 74 y.o. male.  HPI     74 year old comes in a chief complaint of shortness of breath, weakness. Patient has history of hypertension, hyperlipidemia and no underlying lung disease.  He reports feeling generally weak for the last week or so.  He received his COVID-19 vaccine for shot last Tuesday and reports that his weakness started thereafter.  He has generalized weakness with nonproductive cough.  He denies shortness of breath right now, however in triage he did indicate he was short of breath.  It also appears that patient was seen in the clinic today where his O2 sats were in the upper 80s and low 90s.  Review of system is positive for loose bowel movements without any vomiting, UTI-like symptoms.  Patient denies any known sick contacts.  Past Medical History:  Diagnosis Date  . Glaucoma, both eyes   . Hyperlipidemia   . Hypertension   . Legally blind   . Syncope   . Type II diabetes mellitus (HCC) dx'd in ~ 2014    Patient Active Problem List   Diagnosis Date Noted  . Acute respiratory failure with hypoxia (HCC) 04/23/2019  . Acute respiratory failure due to COVID-19 (HCC) 04/23/2019  . ARF (acute renal failure) (HCC) 04/23/2019  . Syncope 02/28/2016  . UTI (urinary tract infection) 02/28/2016  . Hyponatremia 02/28/2016  . AKI (acute kidney injury) (HCC) 02/28/2016  . Hypertension   . Diabetes mellitus without complication (HCC)   . Legally blind   . Diabetes mellitus with complication [facility])     Past Surgical History:  Procedure Laterality Date  . ANKLE FRACTURE SURGERY Right    with pinning  . CATARACT EXTRACTION W/PHACO Left 06/08/2015   Procedure: CATARACT EXTRACTION PHACO AND INTRAOCULAR LENS PLACEMENT (IOC) LEFT EYE;   Surgeon: Chalmers Guest, MD;  Location: [facility] OR;  Service: Ophthalmology;  Laterality: Left;  . COLONOSCOPY    . EYE SURGERY    . FRACTURE SURGERY    . GLAUCOMA SURGERY Left 11/2015   "has tube in to let pressure out" (02/28/2016)  . MINI SHUNT INSERTION Left 01/07/2013   Procedure: MINI TUBE WITH MITOMYCIN C LEFT EYE ;  Surgeon: Chalmers Guest, MD;  Location: [facility] OR;  Service: Ophthalmology;  Laterality: Left;  . TRABECULECTOMY  12/31/2011   Procedure: TRABECULECTOMY;  Surgeon: Chalmers Guest, MD;  Location: [facility] OR;  Service: Ophthalmology;  Laterality: Left;  REVISED OPERATIVE WOUND POST GLAUCOMA SURGERY  . TRABECULECTOMY Left 01/07/2013   Procedure: TRABECULECTOMY;  Surgeon: Chalmers Guest, MD;  Location: [facility] OR;  Service: Ophthalmology;  Laterality: Left;       Family History  Problem Relation Age of Onset  . Diabetes Mother     Social History   Tobacco Use  . Smoking status: Never Smoker  . Smokeless tobacco: Never Used  Substance Use Topics  . Alcohol use: No  . Drug use: No    Home Medications Prior to Admission medications   Medication Sig Start Date End Date Taking? Authorizing Provider  amLODipine (NORVASC) 10 MG tablet Take 10 mg by mouth daily. 03/30/19  Yes [provider]  atorvastatin (LIPITOR) 20 MG tablet Take 20 mg by mouth daily. 05/23/15  Yes [provider]  hydrochlorothiazide (  HYDRODIURIL) 12.5 MG tablet Take 12.5 mg by mouth every morning. 03/30/19  Yes [provider]  irbesartan-hydrochlorothiazide (AVALIDE) 300-12.5 MG tablet Take 1 tablet by mouth daily. 04/23/19  Yes [provider]  JARDIANCE 10 MG TABS tablet Take 10 mg by mouth every morning. 04/01/19  Yes [provider]  latanoprost (XALATAN) 0.005 % ophthalmic solution Place 1 drop into both eyes daily. 04/23/19  Yes [provider]  metFORMIN (GLUCOPHAGE) 1000 MG tablet Take 1,000 mg by mouth 2 (two) times daily. 11/29/15  Yes [provider]     Allergies    Patient has no known allergies.  Review of Systems   Review of Systems  Constitutional: Positive for activity change and fatigue.  Respiratory: Positive for cough and shortness of breath.   Gastrointestinal: Positive for diarrhea.  Skin: Negative for rash and wound.  Allergic/Immunologic: Negative for immunocompromised state.  All other systems reviewed and are negative.   Physical Exam Updated Vital Signs BP 117/64   Pulse 60   Temp 98.9 F (37.2 C) (Oral)   Resp 18   Ht 6\' 4"  (1.93 m)   Wt 105.6 kg   SpO2 93%   BMI 28.34 kg/m   Physical Exam Vitals and nursing note reviewed.  Constitutional:      Appearance: He is well-developed.  HENT:     Head: Atraumatic.  Cardiovascular:     Rate and Rhythm: Normal rate.  Pulmonary:     Effort: Pulmonary effort is normal.     Breath sounds: Wheezing present. No decreased breath sounds, rhonchi or rales.  Musculoskeletal:     Cervical back: Neck supple.  Skin:    General: Skin is warm.  Neurological:     Mental Status: He is alert and oriented to person, place, and time.     ED Results / Procedures / Treatments   Labs (all labs ordered are listed, but only abnormal results are displayed) Labs Reviewed  RESPIRATORY PANEL BY RT PCR (FLU A&B, COVID) - Abnormal; Notable for the following components:      Result Value   SARS Coronavirus 2 by RT PCR POSITIVE (*)    All other components within normal limits  BASIC METABOLIC PANEL - Abnormal; Notable for the following components:   CO2 21 (*)    Glucose, Bld 179 (*)    Creatinine, Ser 1.83 (*)    Calcium 8.8 (*)    GFR calc non Af Amer 36 (*)    GFR calc Af Amer 42 (*)    All other components within normal limits  URINALYSIS, ROUTINE W REFLEX MICROSCOPIC - Abnormal; Notable for the following components:   Color, Urine AMBER (*)    APPearance CLOUDY (*)    Glucose, UA 50 (*)    Hgb urine dipstick MODERATE (*)    Protein, ur >=300 (*)    Bacteria, UA  FEW (*)    All other components within normal limits  CREATININE, SERUM - Abnormal; Notable for the following components:   Creatinine, Ser 1.82 (*)    GFR calc non Af Amer 36 (*)    GFR calc Af Amer 42 (*)    All other components within normal limits  FERRITIN - Abnormal; Notable for the following components:   Ferritin 577 (*)    All other components within normal limits  D-DIMER, QUANTITATIVE (NOT AT [facility]) - Abnormal; Notable for the following components:   D-Dimer, Quant 0.95 (*)    All other components within normal limits  C-REACTIVE PROTEIN - Abnormal; Notable for the following components:   CRP 9.1 (*)    All other components within normal limits  CBG MONITORING, ED - Abnormal; Notable for the following components:   Glucose-Capillary 163 (*)    All other components within normal limits  TROPONIN I (HIGH SENSITIVITY) - Abnormal; Notable for the following components:   Troponin I (High Sensitivity) 20 (*)    All other components within normal limits  CBC  CBC  BRAIN NATRIURETIC PEPTIDE  PROCALCITONIN  POC SARS CORONAVIRUS 2 AG -  ED  TROPONIN I (HIGH SENSITIVITY)    EKG EKG Interpretation  Date/Time:  Thursday April 23 2019 16:20:57 EST Ventricular Rate:  75 PR Interval:    QRS Duration: 94 QT Interval:  347 QTC Calculation: 388 R Axis:   34 Text Interpretation: Sinus rhythm Borderline T abnormalities, anterior leads No acute changes Nonspecific ST and T wave abnormality Confirmed by Varney Biles [phone]) on 04/23/2019 4:22:25 PM   Radiology DG Chest Portable 1 View  Result Date: 04/23/2019 CLINICAL DATA:  Shortness of breath EXAM: PORTABLE CHEST 1 VIEW COMPARISON:  02/28/2016 FINDINGS: The heart size and mediastinal contours are stable. Calcific aortic knob. Interstitial opacities within the bilateral perihilar and bibasilar regions. No pleural effusion or pneumothorax. IMPRESSION: Interstitial opacities within the bilateral perihilar and bibasilar regions.  Findings may reflect interstitial edema versus atypical/viral infection. Electronically Signed   By: Davina Poke D.O.   On: 04/23/2019 16:08    Procedures .Critical Care Performed by: Varney Biles, MD Authorized by: Varney Biles, MD   Critical care provider statement:    Critical care time (minutes):  32   Critical care was time spent personally by me on the following activities:  Discussions with consultants, evaluation of patient's response to treatment, examination of patient, ordering and performing treatments and interventions, ordering and review of laboratory studies, ordering and review of radiographic studies, pulse oximetry, re-evaluation of patient's condition, obtaining history from patient or surrogate and review of old charts   (including critical care time)  Medications Ordered in ED Medications  amLODipine (NORVASC) tablet 10 mg (has no administration in time range)  atorvastatin (LIPITOR) tablet 20 mg (has no administration in time range)  latanoprost (XALATAN) 0.005 % ophthalmic solution 1 drop (has no administration in time range)  acetaminophen (TYLENOL) tablet 650 mg (has no administration in time range)    Or  acetaminophen (TYLENOL) suppository 650 mg (has no administration in time range)  ondansetron (ZOFRAN) tablet 4 mg (has no administration in time range)    Or  ondansetron (ZOFRAN) injection 4 mg (has no administration in time range)  insulin aspart (novoLOG) injection 0-9 Units (has no administration in time range)  enoxaparin (LOVENOX) injection 40 mg (40 mg Subcutaneous Given 04/23/19 2238)  remdesivir 200 mg in sodium chloride 0.9% 250 mL IVPB (200 mg Intravenous New Bag/Given 04/23/19 2328)    Followed by  remdesivir 100 mg in sodium chloride 0.9 % 100 mL IVPB (has no administration in time range)  dexamethasone (DECADRON) injection 6 mg (6 mg Intravenous Given 04/23/19 2237)  acetaminophen (TYLENOL) tablet 650 mg (650 mg Oral Given 04/23/19  1641)    ED Course  I have reviewed the triage vital signs and the nursing notes.  Pertinent labs & imaging results that were available during my care of the patient were reviewed by me and considered in my medical decision making (see chart for details).    MDM Rules/Calculators/A&P  ORVELL CAREAGA was evaluated in Emergency Department on 04/24/2019 for the symptoms described in the history of present illness. He was evaluated in the context of the global COVID-19 pandemic, which necessitated consideration that the patient might be at risk for infection with the SARS-CoV-2 virus that causes COVID-19. Institutional protocols and algorithms that pertain to the evaluation of patients at risk for COVID-19 are in a state of rapid change based on information released by regulatory bodies including the CDC and federal and state organizations. These policies and algorithms were followed during the patient's care in the ED.\   74 year old comes in a chief complaint of weakness. He is noted to be febrile without any tachycardia or tachypnea.  Patient not in respiratory distress.  Allegedly had some hypoxia and is clinic visit today.  Symptoms are consistent with likely COVID-19 infection.  Other possibilities considered include pneumonia that is bacterial, UTIs, influenza.   Reassessment: Discussed case with patient's family.  Patient lives by himself.  He is blind.  They report that he has been sleeping all day and they did not think he is able to take care of himself.  Patient is not a candidate for monoclonal antibodies, as he is almost 10 days out and I doubt that giving him MAB right now would be effective.  He is noted to have intermittent episodes of hypoxia at rest, and I am sure if he ambulated in the he would be hypoxic.  We will admitted to the hospital.   Final Clinical Impression(s) / ED Diagnoses Final diagnoses:  COVID-19 virus infection  AKI (acute kidney injury)  [facility])    Rx / DC Orders ED Discharge Orders    None       Derwood Kaplan, MD 04/24/19 0002

## 2019-04-23 NOTE — ED Notes (Signed)
Pt SPO2 88-92% on RA. Pt placed on 2 lpm O2 via [place].

## 2019-04-23 NOTE — ED Notes (Signed)
Critical alert: Covid-19 positive

## 2019-04-23 NOTE — H&P (Signed)
History and Physical    [name] [name] TML:[id number] DOB: Apr 04, 1945 DOA: 04/23/2019  PCP: Tally Joe, MD  Patient coming from: Home.  Chief Complaint: Shortness of breath.  HPI: [name] [name] is a 74 y.o. male with history of diabetes mellitus type 2, legally blind, hypertension, hyperlipidemia has been experiencing increasing shortness of breath with nonproductive cough some loose stools and weakness with anorexia over the last week after he received the COVID-19 vaccination.  Denies chest pain nausea vomiting.  Given the persistent weakness and shortness of breath patient had gone to his primary care's office and was found to be hypoxic and was referred to the ER.  ED Course: In the ER patient sats were somewhere around 88-94 at different times.  Labs show creatinine of 1.8 CBC unremarkable improvement markers are pending at the time of my admission.  Covid test is positive chest x-ray showed bilateral infiltrates.  Patient had a fever 103 F in the ER.  Patient admitted for Covid pneumonia.  Review of Systems: As per HPI, rest all negative.   Past Medical History:  Diagnosis Date  . Glaucoma, both eyes   . Hyperlipidemia   . Hypertension   . Legally blind   . Syncope   . Type II diabetes mellitus (HCC) dx'd in ~ 2014    Past Surgical History:  Procedure Laterality Date  . ANKLE FRACTURE SURGERY Right    with pinning  . CATARACT EXTRACTION W/PHACO Left 06/08/2015   Procedure: CATARACT EXTRACTION PHACO AND INTRAOCULAR LENS PLACEMENT (IOC) LEFT EYE;  Surgeon: Chalmers Guest, MD;  Location: [facility] OR;  Service: Ophthalmology;  Laterality: Left;  . COLONOSCOPY    . EYE SURGERY    . FRACTURE SURGERY    . GLAUCOMA SURGERY Left 11/2015   "has tube in to let pressure out" (02/28/2016)  . MINI SHUNT INSERTION Left 01/07/2013   Procedure: MINI TUBE WITH MITOMYCIN C LEFT EYE ;  Surgeon: Chalmers Guest, MD;  Location: [facility] OR;  Service: Ophthalmology;  Laterality: Left;  . TRABECULECTOMY   12/31/2011   Procedure: TRABECULECTOMY;  Surgeon: Chalmers Guest, MD;  Location: [facility] OR;  Service: Ophthalmology;  Laterality: Left;  REVISED OPERATIVE WOUND POST GLAUCOMA SURGERY  . TRABECULECTOMY Left 01/07/2013   Procedure: TRABECULECTOMY;  Surgeon: Chalmers Guest, MD;  Location: [facility] OR;  Service: Ophthalmology;  Laterality: Left;     reports that he has never smoked. He has never used smokeless tobacco. He reports that he does not drink alcohol or use drugs.  No Known Allergies  Family History  Problem Relation Age of Onset  . Diabetes Mother     Prior to Admission medications   Medication Sig Start Date End Date Taking? Authorizing Provider  amLODipine (NORVASC) 10 MG tablet Take 10 mg by mouth daily. 03/30/19  Yes [provider]  atorvastatin (LIPITOR) 20 MG tablet Take 20 mg by mouth daily. 05/23/15  Yes [provider]  hydrochlorothiazide (HYDRODIURIL) 12.5 MG tablet Take 12.5 mg by mouth every morning. 03/30/19  Yes [provider]  irbesartan-hydrochlorothiazide (AVALIDE) 300-12.5 MG tablet Take 1 tablet by mouth daily. 04/23/19  Yes [provider]  JARDIANCE 10 MG TABS tablet Take 10 mg by mouth every morning. 04/01/19  Yes [provider]  latanoprost (XALATAN) 0.005 % ophthalmic solution Place 1 drop into both eyes daily. 04/23/19  Yes [provider]  metFORMIN (GLUCOPHAGE) 1000 MG tablet Take 1,000 mg by mouth 2 (two) times daily. 11/29/15  Yes [provider]  Physical Exam: Constitutional: Moderately built and nourished. Vitals:   04/23/19 2000 04/23/19 2015 04/23/19 2030 04/23/19 2122  BP: 109/76 105/67 109/73 120/74  Pulse: 66 66 70 76  Resp: 18 20 19 17   Temp:    98.9 F (37.2 C)  TempSrc:    Oral  SpO2: (!) 89% 93% 94% 94%  Weight:       Eyes: Anicteric no pallor. ENMT: No discharge from the ears eyes nose or mouth. Neck: No mass felt.  No neck rigidity. Respiratory: No rhonchi or crepitations.  Cardiovascular: S1-S2 heard. Abdomen: Soft nontender bowel sounds present. Musculoskeletal: No edema. Skin: No rash. Neurologic: Alert awake oriented to time place and person.  Moves all extremities. Psychiatric: Appears normal with normal affect.   Labs on Admission: I have personally reviewed following labs and imaging studies  CBC: Recent Labs  Lab 04/23/19 1540  WBC 6.9  HGB 14.4  HCT 43.8  MCV 89.2  PLT 557   Basic Metabolic Panel: Recent Labs  Lab 04/23/19 1540  NA 138  K 3.9  CL 106  CO2 21*  GLUCOSE 179*  BUN 23  CREATININE 1.83*  CALCIUM 8.8*   GFR: CrCl cannot be calculated (Unknown ideal weight.). Liver Function Tests: No results for input(s): AST, ALT, ALKPHOS, BILITOT, PROT, ALBUMIN in the last 168 hours. No results for input(s): LIPASE, AMYLASE in the last 168 hours. No results for input(s): AMMONIA in the last 168 hours. Coagulation Profile: No results for input(s): INR, PROTIME in the last 168 hours. Cardiac Enzymes: No results for input(s): CKTOTAL, CKMB, CKMBINDEX, TROPONINI in the last 168 hours. BNP (last 3 results) No results for input(s): PROBNP in the last 8760 hours. HbA1C: No results for input(s): HGBA1C in the last 72 hours. CBG: No results for input(s): GLUCAP in the last 168 hours. Lipid Profile: No results for input(s): CHOL, HDL, LDLCALC, TRIG, CHOLHDL, LDLDIRECT in the last 72 hours. Thyroid Function Tests: No results for input(s): TSH, T4TOTAL, FREET4, T3FREE, THYROIDAB in the last 72 hours. Anemia Panel: No results for input(s): VITAMINB12, FOLATE, FERRITIN, TIBC, IRON, RETICCTPCT in the last 72 hours. Urine analysis:    Component Value Date/Time   COLORURINE AMBER (A) 04/23/2019 1830   APPEARANCEUR CLOUDY (A) 04/23/2019 1830   LABSPEC 1.027 04/23/2019 1830   PHURINE 5.0 04/23/2019 1830   GLUCOSEU 50 (A) 04/23/2019 1830   HGBUR MODERATE (A) 04/23/2019 1830   BILIRUBINUR NEGATIVE 04/23/2019 1830   KETONESUR NEGATIVE  04/23/2019 1830   PROTEINUR >=300 (A) 04/23/2019 1830   UROBILINOGEN 0.2 10/20/2008 2347   NITRITE NEGATIVE 04/23/2019 1830   LEUKOCYTESUR NEGATIVE 04/23/2019 1830   Sepsis Labs: @LABRCNTIP (procalcitonin:4,lacticidven:4) ) Recent Results (from the past 240 hour(s))  Respiratory Panel by RT PCR (Flu A&B, Covid) - Nasopharyngeal Swab     Status: Abnormal   Collection Time: 04/23/19  7:30 PM   Specimen: Nasopharyngeal Swab  Result Value Ref Range Status   SARS Coronavirus 2 by RT PCR POSITIVE (A) NEGATIVE Final    Comment: RESULT CALLED TO, READ BACK BY AND VERIFIED WITH: H MASHBURN RN 04/23/19 2136 JDW (NOTE) SARS-CoV-2 target nucleic acids are DETECTED. SARS-CoV-2 RNA is generally detectable in upper respiratory specimens  during the acute phase of infection. Positive results are indicative of the presence of the identified virus, but do not rule out bacterial infection or co-infection with other pathogens not detected by the test. Clinical correlation with patient history and other diagnostic information is necessary to determine patient infection status. The expected  result is Negative. Fact Sheet for Patients:  https://www.moore.com/ Fact Sheet for Healthcare Providers: https://www.young.biz/ This test is not yet approved or cleared by the [place] FDA and  has been authorized for detection and/or diagnosis of SARS-CoV-2 by FDA under an Emergency Use Authorization (EUA).  This EUA will remain in effect (meaning this test can be used) for th e duration of  the COVID-19 declaration under Section 564(b)(1) of the Act, 21 U.S.C. section 360bbb-3(b)(1), unless the authorization is terminated or revoked sooner.    Influenza A by PCR NEGATIVE NEGATIVE Final   Influenza B by PCR NEGATIVE NEGATIVE Final    Comment: (NOTE) The Xpert Xpress SARS-CoV-2/FLU/RSV assay is intended as an aid in  the diagnosis of influenza from Nasopharyngeal swab  specimens and  should not be used as a sole basis for treatment. Nasal washings and  aspirates are unacceptable for Xpert Xpress SARS-CoV-2/FLU/RSV  testing. Fact Sheet for Patients: https://www.moore.com/ Fact Sheet for Healthcare Providers: https://www.young.biz/ This test is not yet approved or cleared by the [place] FDA and  has been authorized for detection and/or diagnosis of SARS-CoV-2 by  FDA under an Emergency Use Authorization (EUA). This EUA will remain  in effect (meaning this test can be used) for the duration of the  Covid-19 declaration under Section 564(b)(1) of the Act, 21  U.S.C. section 360bbb-3(b)(1), unless the authorization is  terminated or revoked. Performed at [facility], [address], [place], [place] [address]      Radiological Exams on Admission: DG Chest Portable 1 View  Result Date: 04/23/2019 CLINICAL DATA:  Shortness of breath EXAM: PORTABLE CHEST 1 VIEW COMPARISON:  02/28/2016 FINDINGS: The heart size and mediastinal contours are stable. Calcific aortic knob. Interstitial opacities within the bilateral perihilar and bibasilar regions. No pleural effusion or pneumothorax. IMPRESSION: Interstitial opacities within the bilateral perihilar and bibasilar regions. Findings may reflect interstitial edema versus atypical/viral infection. Electronically Signed   By: Duanne Guess D.O.   On: 04/23/2019 16:08    EKG: Independently reviewed.  Normal sinus rhythm with nonspecific T wave changes.  Assessment/Plan Principal Problem:   Acute respiratory failure due to COVID-19 [facility]) Active Problems:   Hypertension   Diabetes mellitus without complication (HCC)   AKI (acute kidney injury) (HCC)   Legally blind   ARF (acute renal failure) (HCC)    1. Acute respiratory failure with hypoxia secondary to Covid infection for which I have placed patient on IV remdesivir and Decadron.  Closely monitor.  Influenza  markers are pending. 2. Acute renal failure likely from poor oral intake and diarrhea.  Gently hydrate hold ARB and hydrochlorothiazide. 3. Hypertension hold ARB and hydrochlorothiazide due to acute renal failure.  Continue amlodipine and as needed IV hydralazine. 4. Diabetes mellitus type 2 we will keep patient on sliding scale coverage may need long-acting insulin while patient is on Decadron. 5. Hyperlipidemia on statins. 6. Legally blind.  Given that patient has acute respiratory failure with Covid infection will need close monitoring for any deterioration and will need inpatient status.   DVT prophylaxis: Lovenox. Code Status: Full code. Family Communication: Discussed with patient. Disposition Plan: Home. Consults called: None. Admission status: Inpatient.   Eduard Clos MD [facility] Pager [phone].  If 7PM-7AM, please contact night-coverage [email or web address] Password [facility]  04/23/2019, 9:58 PM

## 2019-04-23 NOTE — ED Notes (Signed)
Marry Guan daughter [phone] looking for an update on pt

## 2019-04-23 NOTE — ED Triage Notes (Signed)
Pt in with sob, weakness, loss of appetite "after he took Covid vaccine" on 2/16 per daughter. Daughter denies any fevers, but he is shaking a lot per dtr. Got Covid test done PTA at [facility] today.

## 2019-04-24 LAB — COMPREHENSIVE METABOLIC PANEL
ALT: 42 U/L (ref 0–44)
AST: 43 U/L — ABNORMAL HIGH (ref 15–41)
Albumin: 2.5 g/dL — ABNORMAL LOW (ref 3.5–5.0)
Alkaline Phosphatase: 40 U/L (ref 38–126)
Anion gap: 11 (ref 5–15)
BUN: 27 mg/dL — ABNORMAL HIGH (ref 8–23)
CO2: 19 mmol/L — ABNORMAL LOW (ref 22–32)
Calcium: 8.2 mg/dL — ABNORMAL LOW (ref 8.9–10.3)
Chloride: 105 mmol/L (ref 98–111)
Creatinine, Ser: 1.55 mg/dL — ABNORMAL HIGH (ref 0.61–1.24)
GFR calc Af Amer: 51 mL/min — ABNORMAL LOW (ref >60)
GFR calc non Af Amer: 44 mL/min — ABNORMAL LOW (ref >60)
Glucose, Bld: 256 mg/dL — ABNORMAL HIGH (ref 70–99)
Potassium: 4.3 mmol/L (ref 3.5–5.1)
Sodium: 135 mmol/L (ref 135–145)
Total Bilirubin: 0.7 mg/dL (ref 0.3–1.2)
Total Protein: 7 g/dL (ref 6.5–8.1)

## 2019-04-24 LAB — CBC WITH DIFFERENTIAL/PLATELET
Abs Immature Granulocytes: 0 10*3/uL (ref 0.00–0.07)
Basophils Absolute: 0 10*3/uL (ref 0.0–0.1)
Basophils Relative: 0 %
Eosinophils Absolute: 0 10*3/uL (ref 0.0–0.5)
Eosinophils Relative: 0 %
HCT: 42.6 % (ref 39.0–52.0)
Hemoglobin: 13.1 g/dL (ref 13.0–17.0)
Lymphocytes Relative: 3 %
Lymphs Abs: 0.2 10*3/uL — ABNORMAL LOW (ref 0.7–4.0)
MCH: 29.6 pg (ref 26.0–34.0)
MCHC: 30.8 g/dL (ref 30.0–36.0)
MCV: 96.4 fL (ref 80.0–100.0)
Monocytes Absolute: 0 10*3/uL — ABNORMAL LOW (ref 0.1–1.0)
Monocytes Relative: 0 %
Neutro Abs: 5.8 10*3/uL (ref 1.7–7.7)
Neutrophils Relative %: 97 %
Platelets: 272 10*3/uL (ref 150–400)
RBC: 4.42 MIL/uL (ref 4.22–5.81)
RDW: 13.6 % (ref 11.5–15.5)
WBC: 6 10*3/uL (ref 4.0–10.5)
nRBC: 0.3 % — ABNORMAL HIGH (ref 0.0–0.2)
nRBC: 1 /100 WBC — ABNORMAL HIGH

## 2019-04-24 LAB — GLUCOSE, CAPILLARY
Glucose-Capillary: 240 mg/dL — ABNORMAL HIGH (ref 70–99)
Glucose-Capillary: 205 mg/dL — ABNORMAL HIGH (ref 70–99)
Glucose-Capillary: 176 mg/dL — ABNORMAL HIGH (ref 70–99)
Glucose-Capillary: 184 mg/dL — ABNORMAL HIGH (ref 70–99)

## 2019-04-24 LAB — D-DIMER, QUANTITATIVE: D-Dimer, Quant: 0.83 ug/mL-FEU — ABNORMAL HIGH (ref 0.00–0.50)

## 2019-04-24 LAB — PROCALCITONIN: Procalcitonin: 0.17 ng/mL

## 2019-04-24 LAB — C-REACTIVE PROTEIN: CRP: 9.1 mg/dL — ABNORMAL HIGH (ref <1.0)

## 2019-04-24 LAB — TROPONIN I (HIGH SENSITIVITY): Troponin I (High Sensitivity): 21 ng/L — ABNORMAL HIGH (ref <18)

## 2019-04-24 LAB — FERRITIN: Ferritin: 652 ng/mL — ABNORMAL HIGH (ref 24–336)

## 2019-04-24 MED ORDER — HYDRALAZINE HCL 20 MG/ML IJ SOLN: 10.0000 mg | INTRAMUSCULAR | Status: DC | PRN

## 2019-04-24 MED ORDER — SODIUM CHLORIDE 0.9 % IV SOLN: INTRAVENOUS | Status: AC

## 2019-04-24 NOTE — Progress Notes (Signed)
PROGRESS NOTE                                                                                                                                                                                                             Patient Demographics:    [name] [name], is a 74 y.o. male, DOB - 02-Sep-1945, RJJ:[id number]  Outpatient Primary MD for the patient is Tally Joe, MD   Admit date - 04/23/2019   LOS - 1  Chief Complaint  Patient presents with  . Shortness of Breath  . Weakness  . Anorexia       Brief Narrative: Patient is a 74 y.o. male with PMHx of DM-2, HTN, HLD, legally blind-presented with shortness of breath (got his first dose of Covid vaccine approximately 1 week back)-found to have acute hypoxemic respiratory failure secondary to COVID-19 pneumonia.   Subjective:    [name] [name] today is on room air this morning-he feels better.  Continues to have some cough.   Assessment  & Plan :   Acute Hypoxic Resp Failure due to Covid 19 Viral pneumonia: Improved-titrated to room air this morning.  Continue steroids/remdesivir.  Follow clinical trajectory and inflammatory markers.  Fever: afebrile  O2 requirements:  SpO2: 94 %   COVID-19 Labs: Recent Labs    04/23/19 2238 04/24/19 0732  DDIMER 0.95* 0.83*  FERRITIN 577* 652*  CRP 9.1* 9.1*       Component Value Date/Time   BNP 60.0 04/23/2019 2202    Recent Labs  Lab 04/23/19 2238  PROCALCITON 0.17    Lab Results  Component Value Date   SARSCOV2NAA POSITIVE (A) 04/23/2019     COVID-19 Medications: Steroids:2/25>> Remdesivir:2/25>>  Prone/Incentive Spirometry: encouraged  incentive spirometry use 3-4/hour.  DVT Prophylaxis  :  Lovenox   AKI: Likely hemodynamically mediated-improving with supportive care.  Continue to hold ARB HCTZ-continue to avoid nephrotoxic agents.  Follow electrolytes.  HTN: Controlled-continue amlodipine.  ARB/HCTZ on  hold  HLD: Continue statins  DM-2: CBG stable-SSI.  Oral hypoglycemics on hold.  Recent Labs    04/23/19 2240 04/24/19 0752 04/24/19 1156  GLUCAP 163* 240* 205*   Legally blind  Obesity: Estimated body mass index is 29.37 kg/m as calculated from the following:   Height as of this encounter: 6\' 3"  (1.905 m).   Weight as of this encounter: 106.6  kg.   Consults  :  None  Procedures  :  None  ABG:    Component Value Date/Time   TCO2 24 10/20/2012 1732    Vent Settings: N/A  Condition -  Guarded  Family Communication  :  Daughter updated over the phone 2/26  Code Status :  Full Code  Diet :  Diet Order            Diet heart healthy/carb modified Room service appropriate? Yes; Fluid consistency: Thin  Diet effective now               Disposition Plan  :  Remain hospitalized  Barriers to discharge: Hypoxia requiring O2 supplementation/complete 5 days of IV Remdesivir  Antimicorbials  :    Anti-infectives (From admission, onward)   Start     Dose/Rate Route Frequency Ordered Stop   04/24/19 1000  remdesivir 100 mg in sodium chloride 0.9 % 100 mL IVPB     100 mg 200 mL/hr over 30 Minutes Intravenous Daily 04/23/19 2201 04/28/19 0959   04/23/19 2215  remdesivir 200 mg in sodium chloride 0.9% 250 mL IVPB     200 mg 580 mL/hr over 30 Minutes Intravenous Once 04/23/19 2201 04/24/19 0008      Inpatient Medications  Scheduled Meds: . amLODipine  10 mg Oral Daily  . atorvastatin  20 mg Oral Daily  . dexamethasone (DECADRON) injection  6 mg Intravenous Q24H  . enoxaparin (LOVENOX) injection  40 mg Subcutaneous Q24H  . insulin aspart  0-9 Units Subcutaneous TID WC  . latanoprost  1 drop Both Eyes Daily   Continuous Infusions: . sodium chloride 75 mL/hr at 04/24/19 0558  . remdesivir 100 mg in NS 100 mL 100 mg (04/24/19 0908)   PRN Meds:.acetaminophen **OR** acetaminophen, hydrALAZINE, ondansetron **OR** ondansetron (ZOFRAN) IV   Time Spent in minutes   25  See all Orders from today for further details   Jeoffrey Massed M.D on 04/24/2019 at 1:42 PM  To page go to [email or web address] - use universal password  [facility] -  Office  [phone]    Objective:   Vitals:   04/24/19 0609 04/24/19 0854 04/24/19 0900 04/24/19 1235  BP: 134/81 135/81 135/81 110/76  Pulse: 68  (!) 59 67  Resp: 16  14 17   Temp: 98.3 F (36.8 C)   98.1 F (36.7 C)  TempSrc: Axillary   Oral  SpO2: 96%  96% 94%  Weight:      Height:        Wt Readings from Last 3 Encounters:  04/24/19 106.6 kg  02/28/16 105.6 kg  02/19/16 105.2 kg     Intake/Output Summary (Last 24 hours) at 04/24/2019 1342 Last data filed at 04/24/2019 1300 Gross per 24 hour  Intake 760 ml  Output 340 ml  Net 420 ml     Physical Exam Gen Exam:Alert awake-not in any distress HEENT:atraumatic, normocephalic Chest: B/L clear to auscultation anteriorly CVS:S1S2 regular Abdomen:soft non tender, non distended Extremities:no edema Neurology: Non focal Skin: no rash   Data Review:    CBC Recent Labs  Lab 04/23/19 1540 04/23/19 2238 04/24/19 0732  WBC 6.9 6.2 6.0  HGB 14.4 13.7 13.1  HCT 43.8 40.3 42.6  PLT 241 232 272  MCV 89.2 88.8 96.4  MCH 29.3 30.2 29.6  MCHC 32.9 34.0 30.8  RDW 13.3 13.3 13.6  LYMPHSABS  --   --  0.2*  MONOABS  --   --  0.0*  EOSABS  --   --  0.0  BASOSABS  --   --  0.0    Chemistries  Recent Labs  Lab 04/23/19 1540 04/23/19 2238 04/24/19 0732  NA 138  --  135  K 3.9  --  4.3  CL 106  --  105  CO2 21*  --  19*  GLUCOSE 179*  --  256*  BUN 23  --  27*  CREATININE 1.83* 1.82* 1.55*  CALCIUM 8.8*  --  8.2*  AST  --   --  43*  ALT  --   --  42  ALKPHOS  --   --  40  BILITOT  --   --  0.7   ------------------------------------------------------------------------------------------------------------------ No results for input(s): CHOL, HDL, LDLCALC, TRIG, CHOLHDL, LDLDIRECT in the last 72 hours.  Lab Results  Component  Value Date   HGBA1C >15.5 (H) 02/28/2016   ------------------------------------------------------------------------------------------------------------------ No results for input(s): TSH, T4TOTAL, T3FREE, THYROIDAB in the last 72 hours.  Invalid input(s): FREET3 ------------------------------------------------------------------------------------------------------------------ Recent Labs    04/23/19 2238 04/24/19 0732  FERRITIN 577* 652*    Coagulation profile No results for input(s): INR, PROTIME in the last 168 hours.  Recent Labs    04/23/19 2238 04/24/19 0732  DDIMER 0.95* 0.83*    Cardiac Enzymes No results for input(s): CKMB, TROPONINI, MYOGLOBIN in the last 168 hours.  Invalid input(s): CK ------------------------------------------------------------------------------------------------------------------    Component Value Date/Time   BNP 60.0 04/23/2019 2202    Micro Results Recent Results (from the past 240 hour(s))  Respiratory Panel by RT PCR (Flu A&B, Covid) - Nasopharyngeal Swab     Status: Abnormal   Collection Time: 04/23/19  7:30 PM   Specimen: Nasopharyngeal Swab  Result Value Ref Range Status   SARS Coronavirus 2 by RT PCR POSITIVE (A) NEGATIVE Final    Comment: RESULT CALLED TO, READ BACK BY AND VERIFIED WITH: H MASHBURN RN 04/23/19 2136 JDW (NOTE) SARS-CoV-2 target nucleic acids are DETECTED. SARS-CoV-2 RNA is generally detectable in upper respiratory specimens  during the acute phase of infection. Positive results are indicative of the presence of the identified virus, but do not rule out bacterial infection or co-infection with other pathogens not detected by the test. Clinical correlation with patient history and other diagnostic information is necessary to determine patient infection status. The expected result is Negative. Fact Sheet for Patients:  https://www.moore.com/ Fact Sheet for Healthcare  Providers: https://www.young.biz/ This test is not yet approved or cleared by the [place] FDA and  has been authorized for detection and/or diagnosis of SARS-CoV-2 by FDA under an Emergency Use Authorization (EUA).  This EUA will remain in effect (meaning this test can be used) for th e duration of  the COVID-19 declaration under Section 564(b)(1) of the Act, 21 U.S.C. section 360bbb-3(b)(1), unless the authorization is terminated or revoked sooner.    Influenza A by PCR NEGATIVE NEGATIVE Final   Influenza B by PCR NEGATIVE NEGATIVE Final    Comment: (NOTE) The Xpert Xpress SARS-CoV-2/FLU/RSV assay is intended as an aid in  the diagnosis of influenza from Nasopharyngeal swab specimens and  should not be used as a sole basis for treatment. Nasal washings and  aspirates are unacceptable for Xpert Xpress SARS-CoV-2/FLU/RSV  testing. Fact Sheet for Patients: https://www.moore.com/ Fact Sheet for Healthcare Providers: https://www.young.biz/ This test is not yet approved or cleared by the [place] FDA and  has been authorized for detection and/or diagnosis of SARS-CoV-2 by  FDA under an Emergency Use Authorization (EUA). This EUA will remain  in effect (meaning this test can be used) for the duration of the  Covid-19 declaration under Section 564(b)(1) of the Act, 21  U.S.C. section 360bbb-3(b)(1), unless the authorization is  terminated or revoked. Performed at [facility], [place] [address] [place], [place] [address]     Radiology Reports DG Chest Portable 1 View  Result Date: 04/23/2019 CLINICAL DATA:  Shortness of breath EXAM: PORTABLE CHEST 1 VIEW COMPARISON:  02/28/2016 FINDINGS: The heart size and mediastinal contours are stable. Calcific aortic knob. Interstitial opacities within the bilateral perihilar and bibasilar regions. No pleural effusion or pneumothorax. IMPRESSION: Interstitial opacities within  the bilateral perihilar and bibasilar regions. Findings may reflect interstitial edema versus atypical/viral infection. Electronically Signed   By: Davina Poke D.O.   On: 04/23/2019 16:08

## 2019-04-24 NOTE — Progress Notes (Signed)
Patient's daughter Marry Guan updated about plan of care. All question were answered.

## 2019-04-24 NOTE — Progress Notes (Signed)
Patient received to the unit. Patient is alert and oriented x4. Vital signs are stable. Iv in place. Patient denies for pain. No distress noted. Skin assessment done with another nurse, Given instructions to the patient about call bell and phone. Bed in low position and call bell , phone and necessary items Is in reach.

## 2019-04-24 NOTE — Progress Notes (Signed)
Updated daughter Eather Colas

## 2019-04-24 NOTE — Progress Notes (Signed)
Physical Therapy Evaluation   Physical Therapy Clinical Impression:  PTA pt lived alone in apartment with level entry. Pt completely independent, ambulating with guide cane, working at [employer] for [employer]. Pt is currently limited in safe mobility by decreased familiarity with environment, and lack of his guide cane. Pt is mod I for bed mobility, and transfers and min A for navigation with ambulation of 100 feet. Pt able to ambulate on RA with SaO2 89-94%O2. Pt will not need any additional PT needs at discharge. However, PT will continue to follow acutely until discharge to progress mobilization.     04/24/19 1000  PT Visit Information  Last PT Received On 04/24/19  Assistance Needed +1  History of Present Illness 74 y.o. male with history of diabetes mellitus type 2, legally blind, hypertension, hyperlipidemia has been experiencing increasing shortness of breath with nonproductive cough some loose stools and weakness with anorexia over the last week after he received the COVID-19 vaccination. Presented to ED with SaO2 88-94% Covid test is positive chest x-ray showed bilateral infiltrates Admitted 04/23/19 with COVID PNA  Precautions  Precautions Other (comment) (legally blind)  Precaution Comments uses guide cane but does not have with him  Restrictions  Weight Bearing Restrictions No  Home Living  Family/patient expects to be discharged to: Private residence  Living Arrangements Alone  Available Help at Discharge Family;Friend(s);Available 24 hours/day  Type of Home Apartment  Home Access Level entry  Home Layout One level  Bathroom Field seismologist Yes  Home Equipment  (guide cane)  Prior Function  Level of Independence Independent  Comments works with [employer] for [employer] No difficulties  Pain Assessment  Pain Assessment No/denies pain  Cognition  Arousal/Alertness Awake/alert   Behavior During Therapy WFL for tasks assessed/performed  Overall Cognitive Status Within Functional Limits for tasks assessed  Upper Extremity Assessment  Upper Extremity Assessment Overall WFL for tasks assessed  Lower Extremity Assessment  Lower Extremity Assessment Overall WFL for tasks assessed  Cervical / Trunk Assessment  Cervical / Trunk Assessment Normal  Bed Mobility  Overal bed mobility Modified Independent  General bed mobility comments HoB elevated and use of bed rail   Transfers  Overall transfer level Modified independent  General transfer comment increased time to steady, but able to complere without assist from bed surface, use of BSC over toilet for power up after using toilet  Ambulation/Gait  Ambulation/Gait assistance Min assist  Gait Distance (Feet) 100 Feet  Assistive device 1 person hand held assist  Gait Pattern/deviations Step-through pattern;Decreased step length - right;Decreased step length - left  General Gait Details min A for navigaition with verbal and tactile cues due to visual impairments, small, slow, cautious steps mainly due to novel environment   Gait velocity slowed   Gait velocity interpretation <1.31 ft/sec, indicative of household ambulator  Balance  Overall balance assessment Needs assistance  Sitting-balance support Feet supported;No upper extremity supported  Sitting balance-Leahy Scale Normal  Standing balance support During functional activity;No upper extremity supported  Standing balance-Leahy Scale Fair  General Comments  General comments (skin integrity, edema, etc.) BP 115/76 in supine, no c/o of dizziness or lightheadedness  PT - End of Session  Equipment Utilized During Treatment Gait belt  Activity Tolerance Patient tolerated treatment well  Patient left in chair;with call bell/phone within reach;with chair alarm set  Nurse Communication Mobility status  PT Assessment  PT Recommendation/Assessment Patient needs continued PT  services  PT Visit Diagnosis Unsteadiness on feet (R26.81);Other abnormalities of gait and mobility (R26.89);Difficulty in walking, not elsewhere classified (R26.2)  PT Problem List Decreased mobility;Decreased balance  PT Plan  PT Frequency (ACUTE ONLY) Min 3X/week  PT Treatment/Interventions (ACUTE ONLY) Gait training;Functional mobility training;Therapeutic activities;Therapeutic exercise;Balance training;Cognitive remediation;Patient/family education  AM-PAC PT "6 Clicks" Mobility Outcome Measure (Version 2)  Help needed turning from your back to your side while in a flat bed without using bedrails? 4  Help needed moving from lying on your back to sitting on the side of a flat bed without using bedrails? 3  Help needed moving to and from a bed to a chair (including a wheelchair)? 4  Help needed standing up from a chair using your arms (e.g., wheelchair or bedside chair)? 4  Help needed to walk in hospital room? 3  Help needed climbing 3-5 steps with a railing?  3  6 Click Score 21  Consider Recommendation of Discharge To: Home with no services  PT Recommendation  Follow Up Recommendations No PT follow up  PT equipment None recommended by PT  Individuals Consulted  Consulted and Agree with Results and Recommendations Patient  Acute Rehab PT Goals  Patient Stated Goal feel better  PT Goal Formulation With patient  Time For Goal Achievement 05/08/19  Potential to Achieve Goals Good  PT Time Calculation  PT Start Time (ACUTE ONLY) 1026  PT General Charges  $$ ACUTE PT VISIT 1 Visit  PT Evaluation  $PT Eval Moderate Complexity 1 Mod  PT Treatments  $Gait Training 8-22 mins   Klaira Pesci B. Migdalia Dk PT, DPT Acute Rehabilitation Services Pager [phone] Office [phone]

## 2019-04-25 LAB — CBC
HCT: 39.6 % (ref 39.0–52.0)
Hemoglobin: 13.3 g/dL (ref 13.0–17.0)
MCH: 29.6 pg (ref 26.0–34.0)
MCHC: 33.6 g/dL (ref 30.0–36.0)
MCV: 88.2 fL (ref 80.0–100.0)
Platelets: 273 10*3/uL (ref 150–400)
RBC: 4.49 MIL/uL (ref 4.22–5.81)
RDW: 13.2 % (ref 11.5–15.5)
WBC: 4.7 10*3/uL (ref 4.0–10.5)
nRBC: 0 % (ref 0.0–0.2)

## 2019-04-25 LAB — COMPREHENSIVE METABOLIC PANEL
ALT: 43 U/L (ref 0–44)
AST: 40 U/L (ref 15–41)
Albumin: 2.5 g/dL — ABNORMAL LOW (ref 3.5–5.0)
Alkaline Phosphatase: 38 U/L (ref 38–126)
Anion gap: 11 (ref 5–15)
BUN: 33 mg/dL — ABNORMAL HIGH (ref 8–23)
CO2: 21 mmol/L — ABNORMAL LOW (ref 22–32)
Calcium: 8.6 mg/dL — ABNORMAL LOW (ref 8.9–10.3)
Chloride: 102 mmol/L (ref 98–111)
Creatinine, Ser: 1.28 mg/dL — ABNORMAL HIGH (ref 0.61–1.24)
GFR calc Af Amer: >60 mL/min (ref >60)
GFR calc non Af Amer: 56 mL/min — ABNORMAL LOW (ref >60)
Glucose, Bld: 255 mg/dL — ABNORMAL HIGH (ref 70–99)
Potassium: 4.2 mmol/L (ref 3.5–5.1)
Sodium: 134 mmol/L — ABNORMAL LOW (ref 135–145)
Total Bilirubin: 0.9 mg/dL (ref 0.3–1.2)
Total Protein: 7.3 g/dL (ref 6.5–8.1)

## 2019-04-25 LAB — GLUCOSE, CAPILLARY
Glucose-Capillary: 263 mg/dL — ABNORMAL HIGH (ref 70–99)
Glucose-Capillary: 250 mg/dL — ABNORMAL HIGH (ref 70–99)
Glucose-Capillary: 217 mg/dL — ABNORMAL HIGH (ref 70–99)

## 2019-04-25 LAB — FERRITIN: Ferritin: 681 ng/mL — ABNORMAL HIGH (ref 24–336)

## 2019-04-25 LAB — BRAIN NATRIURETIC PEPTIDE: B Natriuretic Peptide: 71 pg/mL (ref 0.0–100.0)

## 2019-04-25 LAB — D-DIMER, QUANTITATIVE: D-Dimer, Quant: 0.56 ug/mL-FEU — ABNORMAL HIGH (ref 0.00–0.50)

## 2019-04-25 LAB — C-REACTIVE PROTEIN: CRP: 6.2 mg/dL — ABNORMAL HIGH (ref <1.0)

## 2019-04-25 NOTE — Progress Notes (Signed)
PROGRESS NOTE                                                                                                                                                                                                             Patient Demographics:    [name] [name], is a 74 y.o. male, DOB - [birth year], RUE:[id number]  Outpatient Primary MD for the patient is Antony Contras, MD   Admit date - 04/23/2019   LOS - 2  Chief Complaint  Patient presents with  . Shortness of Breath  . Weakness  . Anorexia       Brief Narrative: Patient is a 74 y.o. male with PMHx of DM-2, HTN, HLD, legally blind-presented with shortness of breath (got his first dose of Covid vaccine approximately 1 week back)-found to have acute hypoxemic respiratory failure secondary to COVID-19 pneumonia.   Subjective:   Patient in bed, appears comfortable, denies any headache, no fever, no chest pain or pressure, no shortness of breath , no abdominal pain. No focal weakness.    Assessment  & Plan :   Acute Hypoxic Resp Failure due to Covid 19 Viral pneumonia: Improved-titrated to room air this morning.  Continue steroids/remdesivir.  Follow clinical trajectory and inflammatory markers.  Fever: afebrile  O2 requirements:  SpO2: 96 %   COVID-19 Labs: Recent Labs    04/23/19 2238 04/24/19 0732 04/25/19 0147  DDIMER 0.95* 0.83* 0.56*  FERRITIN 577* 652* 681*  CRP 9.1* 9.1* 6.2*       Component Value Date/Time   BNP 71.0 04/25/2019 0147    Recent Labs  Lab 04/23/19 2238  PROCALCITON 0.17    Lab Results  Component Value Date   SARSCOV2NAA POSITIVE (A) 04/23/2019     COVID-19 Medications: Steroids:2/25>> Remdesivir:2/25>>  Prone/Incentive Spirometry: encouraged  incentive spirometry use 3-4/hour.   AKI: Likely hemodynamically mediated-improving with supportive care.  Continue to hold ARB HCTZ-continue to avoid nephrotoxic agents.  AKI  improving.  HTN: Controlled-continue amlodipine.  ARB/HCTZ on hold  HLD: Continue statins  Legally blind  DM-2: CBG stable-SSI.  Oral hypoglycemics on hold.  Recent Labs    04/24/19 1634 04/24/19 2125 04/25/19 0721  GLUCAP 176* 184* 263*     Consults  :  None  Procedures  :  None  Condition -  Guarded  Family Communication  :  Daughter  updated over the phone 2/26 by previous MD  Code Status :  Full Code  Diet :  Diet Order            Diet heart healthy/carb modified Room service appropriate? Yes; Fluid consistency: Thin  Diet effective now               Disposition Plan  :  Remain hospitalized  Barriers to discharge: Complete 5 days of IV Remdesivir   Antimicorbials  :    Anti-infectives (From admission, onward)   Start     Dose/Rate Route Frequency Ordered Stop   04/24/19 1000  remdesivir 100 mg in sodium chloride 0.9 % 100 mL IVPB     100 mg 200 mL/hr over 30 Minutes Intravenous Daily 04/23/19 2201 04/28/19 0959   04/23/19 2215  remdesivir 200 mg in sodium chloride 0.9% 250 mL IVPB     200 mg 580 mL/hr over 30 Minutes Intravenous Once 04/23/19 2201 04/24/19 0008     DVT prophylaxis.  Lovenox  Inpatient Medications  Scheduled Meds: . amLODipine  10 mg Oral Daily  . atorvastatin  20 mg Oral Daily  . dexamethasone (DECADRON) injection  6 mg Intravenous Q24H  . enoxaparin (LOVENOX) injection  40 mg Subcutaneous Q24H  . insulin aspart  0-9 Units Subcutaneous TID WC  . latanoprost  1 drop Both Eyes Daily   Continuous Infusions: . remdesivir 100 mg in NS 100 mL 100 mg (04/25/19 1122)   PRN Meds:.acetaminophen **OR** acetaminophen, hydrALAZINE, ondansetron **OR** ondansetron (ZOFRAN) IV   Time Spent in minutes  25  See all Orders from today for further details   Susa Raring M.D on 04/25/2019 at 11:35 AM  To page go to [email or web address] - use universal password  [facility] -  Office  [phone]    Objective:   Vitals:   04/25/19  0600 04/25/19 0639 04/25/19 0745 04/25/19 1117  BP:  (!) 131/99  116/83  Pulse: 63 (!) 54    Resp: 19 18    Temp:  98.1 F (36.7 C) 97.9 F (36.6 C)   TempSrc:  Oral Oral   SpO2: 98% 96%    Weight:      Height:        Wt Readings from Last 3 Encounters:  04/24/19 106.6 kg  02/28/16 105.6 kg  02/19/16 105.2 kg     Intake/Output Summary (Last 24 hours) at 04/25/2019 1135 Last data filed at 04/25/2019 0556 Gross per 24 hour  Intake 1466.78 ml  Output --  Net 1466.78 ml     Physical Exam  Awake Alert, legally blind [place].AT,PERRAL Supple Neck,No JVD, No cervical lymphadenopathy appriciated.  Symmetrical Chest wall movement, Good air movement bilaterally, CTAB RRR,No Gallops, Rubs or new Murmurs, No Parasternal Heave +ve B.Sounds, Abd Soft, No tenderness, No organomegaly appriciated, No rebound - guarding or rigidity. No Cyanosis, Clubbing or edema, No new Rash or bruise   Data Review:    CBC Recent Labs  Lab 04/23/19 1540 04/23/19 2238 04/24/19 0732 04/25/19 0147  WBC 6.9 6.2 6.0 4.7  HGB 14.4 13.7 13.1 13.3  HCT 43.8 40.3 42.6 39.6  PLT 241 232 272 273  MCV 89.2 88.8 96.4 88.2  MCH 29.3 30.2 29.6 29.6  MCHC 32.9 34.0 30.8 33.6  RDW 13.3 13.3 13.6 13.2  LYMPHSABS  --   --  0.2*  --   MONOABS  --   --  0.0*  --   EOSABS  --   --  0.0  --  BASOSABS  --   --  0.0  --     Chemistries  Recent Labs  Lab 04/23/19 1540 04/23/19 2238 04/24/19 0732 04/25/19 0147  NA 138  --  135 134*  K 3.9  --  4.3 4.2  CL 106  --  105 102  CO2 21*  --  19* 21*  GLUCOSE 179*  --  256* 255*  BUN 23  --  27* 33*  CREATININE 1.83* 1.82* 1.55* 1.28*  CALCIUM 8.8*  --  8.2* 8.6*  AST  --   --  43* 40  ALT  --   --  42 43  ALKPHOS  --   --  40 38  BILITOT  --   --  0.7 0.9   ------------------------------------------------------------------------------------------------------------------ No results for input(s): CHOL, HDL, LDLCALC, TRIG, CHOLHDL, LDLDIRECT in the last 72  hours.  Lab Results  Component Value Date   HGBA1C >15.5 (H) 02/28/2016   ------------------------------------------------------------------------------------------------------------------ No results for input(s): TSH, T4TOTAL, T3FREE, THYROIDAB in the last 72 hours.  Invalid input(s): FREET3 ------------------------------------------------------------------------------------------------------------------ Recent Labs    04/24/19 0732 04/25/19 0147  FERRITIN 652* 681*    Coagulation profile No results for input(s): INR, PROTIME in the last 168 hours.  Recent Labs    04/24/19 0732 04/25/19 0147  DDIMER 0.83* 0.56*    Cardiac Enzymes No results for input(s): CKMB, TROPONINI, MYOGLOBIN in the last 168 hours.  Invalid input(s): CK ------------------------------------------------------------------------------------------------------------------    Component Value Date/Time   BNP 71.0 04/25/2019 0147    Micro Results Recent Results (from the past 240 hour(s))  Respiratory Panel by RT PCR (Flu A&B, Covid) - Nasopharyngeal Swab     Status: Abnormal   Collection Time: 04/23/19  7:30 PM   Specimen: Nasopharyngeal Swab  Result Value Ref Range Status   SARS Coronavirus 2 by RT PCR POSITIVE (A) NEGATIVE Final    Comment: RESULT CALLED TO, READ BACK BY AND VERIFIED WITH: H MASHBURN RN 04/23/19 2136 JDW (NOTE) SARS-CoV-2 target nucleic acids are DETECTED. SARS-CoV-2 RNA is generally detectable in upper respiratory specimens  during the acute phase of infection. Positive results are indicative of the presence of the identified virus, but do not rule out bacterial infection or co-infection with other pathogens not detected by the test. Clinical correlation with patient history and other diagnostic information is necessary to determine patient infection status. The expected result is Negative. Fact Sheet for Patients:  https://www.moore.com/ Fact Sheet for  Healthcare Providers: https://www.young.biz/ This test is not yet approved or cleared by the [place] FDA and  has been authorized for detection and/or diagnosis of SARS-CoV-2 by FDA under an Emergency Use Authorization (EUA).  This EUA will remain in effect (meaning this test can be used) for th e duration of  the COVID-19 declaration under Section 564(b)(1) of the Act, 21 U.S.C. section 360bbb-3(b)(1), unless the authorization is terminated or revoked sooner.    Influenza A by PCR NEGATIVE NEGATIVE Final   Influenza B by PCR NEGATIVE NEGATIVE Final    Comment: (NOTE) The Xpert Xpress SARS-CoV-2/FLU/RSV assay is intended as an aid in  the diagnosis of influenza from Nasopharyngeal swab specimens and  should not be used as a sole basis for treatment. Nasal washings and  aspirates are unacceptable for Xpert Xpress SARS-CoV-2/FLU/RSV  testing. Fact Sheet for Patients: https://www.moore.com/ Fact Sheet for Healthcare Providers: https://www.young.biz/ This test is not yet approved or cleared by the [place] FDA and  has been authorized for detection and/or diagnosis of  SARS-CoV-2 by  FDA under an Emergency Use Authorization (EUA). This EUA will remain  in effect (meaning this test can be used) for the duration of the  Covid-19 declaration under Section 564(b)(1) of the Act, 21  U.S.C. section 360bbb-3(b)(1), unless the authorization is  terminated or revoked. Performed at [facility], [address], [place], [place] [address]     Radiology Reports DG Chest Portable 1 View  Result Date: 04/23/2019 CLINICAL DATA:  Shortness of breath EXAM: PORTABLE CHEST 1 VIEW COMPARISON:  02/28/2016 FINDINGS: The heart size and mediastinal contours are stable. Calcific aortic knob. Interstitial opacities within the bilateral perihilar and bibasilar regions. No pleural effusion or pneumothorax. IMPRESSION: Interstitial  opacities within the bilateral perihilar and bibasilar regions. Findings may reflect interstitial edema versus atypical/viral infection. Electronically Signed   By: Duanne Guess D.O.   On: 04/23/2019 16:08

## 2019-04-26 LAB — CBC WITH DIFFERENTIAL/PLATELET
Abs Immature Granulocytes: 0.03 10*3/uL (ref 0.00–0.07)
Basophils Absolute: 0 10*3/uL (ref 0.0–0.1)
Basophils Relative: 0 %
Eosinophils Absolute: 0 10*3/uL (ref 0.0–0.5)
Eosinophils Relative: 0 %
HCT: 40.3 % (ref 39.0–52.0)
Hemoglobin: 13.4 g/dL (ref 13.0–17.0)
Immature Granulocytes: 1 %
Lymphocytes Relative: 16 %
Lymphs Abs: 0.7 10*3/uL (ref 0.7–4.0)
MCH: 29.7 pg (ref 26.0–34.0)
MCHC: 33.3 g/dL (ref 30.0–36.0)
MCV: 89.4 fL (ref 80.0–100.0)
Monocytes Absolute: 0.2 10*3/uL (ref 0.1–1.0)
Monocytes Relative: 4 %
Neutro Abs: 3.7 10*3/uL (ref 1.7–7.7)
Neutrophils Relative %: 79 %
Platelets: 298 10*3/uL (ref 150–400)
RBC: 4.51 MIL/uL (ref 4.22–5.81)
RDW: 13.2 % (ref 11.5–15.5)
WBC: 4.6 10*3/uL (ref 4.0–10.5)
nRBC: 0 % (ref 0.0–0.2)

## 2019-04-26 LAB — COMPREHENSIVE METABOLIC PANEL
ALT: 41 U/L (ref 0–44)
AST: 33 U/L (ref 15–41)
Albumin: 2.5 g/dL — ABNORMAL LOW (ref 3.5–5.0)
Alkaline Phosphatase: 35 U/L — ABNORMAL LOW (ref 38–126)
Anion gap: 11 (ref 5–15)
BUN: 30 mg/dL — ABNORMAL HIGH (ref 8–23)
CO2: 21 mmol/L — ABNORMAL LOW (ref 22–32)
Calcium: 8.4 mg/dL — ABNORMAL LOW (ref 8.9–10.3)
Chloride: 104 mmol/L (ref 98–111)
Creatinine, Ser: 1.28 mg/dL — ABNORMAL HIGH (ref 0.61–1.24)
GFR calc Af Amer: >60 mL/min (ref >60)
GFR calc non Af Amer: 56 mL/min — ABNORMAL LOW (ref >60)
Glucose, Bld: 294 mg/dL — ABNORMAL HIGH (ref 70–99)
Potassium: 4.4 mmol/L (ref 3.5–5.1)
Sodium: 136 mmol/L (ref 135–145)
Total Bilirubin: 1.2 mg/dL (ref 0.3–1.2)
Total Protein: 7.1 g/dL (ref 6.5–8.1)

## 2019-04-26 LAB — D-DIMER, QUANTITATIVE: D-Dimer, Quant: 0.4 ug/mL-FEU (ref 0.00–0.50)

## 2019-04-26 LAB — MAGNESIUM: Magnesium: 2 mg/dL (ref 1.7–2.4)

## 2019-04-26 LAB — TSH: TSH: 0.253 u[IU]/mL — ABNORMAL LOW (ref 0.350–4.500)

## 2019-04-26 LAB — GLUCOSE, CAPILLARY
Glucose-Capillary: 253 mg/dL — ABNORMAL HIGH (ref 70–99)
Glucose-Capillary: 239 mg/dL — ABNORMAL HIGH (ref 70–99)
Glucose-Capillary: 217 mg/dL — ABNORMAL HIGH (ref 70–99)
Glucose-Capillary: 166 mg/dL — ABNORMAL HIGH (ref 70–99)

## 2019-04-26 LAB — BRAIN NATRIURETIC PEPTIDE: B Natriuretic Peptide: 92.2 pg/mL (ref 0.0–100.0)

## 2019-04-26 LAB — C-REACTIVE PROTEIN: CRP: 2.6 mg/dL — ABNORMAL HIGH (ref <1.0)

## 2019-04-26 LAB — T4, FREE: Free T4: 1.29 ng/dL — ABNORMAL HIGH (ref 0.61–1.12)

## 2019-04-26 MED ORDER — DEXAMETHASONE SODIUM PHOSPHATE 4 MG/ML IJ SOLN
4.0000 mg | INTRAMUSCULAR | Status: DC
  Administered 2019-04-26: 4 mg via INTRAVENOUS
  Filled 2019-04-26: qty 1

## 2019-04-26 MED ORDER — INSULIN ASPART 100 UNIT/ML ~~LOC~~ SOLN
0.0000 [IU] | Freq: Three times a day (TID) | SUBCUTANEOUS | Status: DC
  Administered 2019-04-26: 3 [IU] via SUBCUTANEOUS
  Administered 2019-04-27 (×2): 5 [IU] via SUBCUTANEOUS

## 2019-04-26 MED ORDER — INSULIN GLARGINE 100 UNIT/ML ~~LOC~~ SOLN
10.0000 [IU] | Freq: Every day | SUBCUTANEOUS | Status: DC
  Administered 2019-04-26 – 2019-04-27 (×2): 10 [IU] via SUBCUTANEOUS
  Filled 2019-04-26 (×2): qty 0.1

## 2019-04-26 MED ORDER — INSULIN ASPART 100 UNIT/ML ~~LOC~~ SOLN: 0.0000 [IU] | Freq: Every day | SUBCUTANEOUS | Status: DC

## 2019-04-26 NOTE — Evaluation (Addendum)
Occupational Therapy Evaluation Patient Details Name: [name] [name] MRN: [id number] DOB: Aug 11, 1945 Today's Date: 04/26/2019    History of Present Illness 74 y.o. male with history of diabetes mellitus type 2, legally blind, hypertension, hyperlipidemia has been experiencing increasing shortness of breath with nonproductive cough some loose stools and weakness with anorexia over the last week after he received the COVID-19 vaccination. Presented to ED with SaO2 88-94% Covid test is positive chest x-ray showed bilateral infiltrates Admitted 04/23/19 with COVID PNA   Clinical Impression   This 74 y/o male presents with the above. PTA pt reports independence with ADL and functional mobility, pt's family assists with driving (including to work); pt typically mobilizes with guide cane given baseline visual deficits. Today pt completing room level mobility with minA (HHA), provided verbal/tactile cues during mobility to assist pt with identifying location of doorways/items in room to increase his familiarity with current surroundings. Pt overall requiring up to minA for ADL tasks, partly due to pt performing mobility/ADL in unfamiliar environment. VSS on RA. Pt reports has support from daughter/sister at time of discharge PRN. He will benefit from continued acute OT services to maximize his safety and independence with ADL and mobility prior to discharge. Will follow.     Follow Up Recommendations  No OT follow up;Supervision/Assistance - 24 hour(24hr initially)    Equipment Recommendations  None recommended by OT           Precautions / Restrictions Precautions Precautions: Other (comment)(legally blind) Precaution Comments: uses guide cane but does not have with him Restrictions Weight Bearing Restrictions: No      Mobility Bed Mobility Overal bed mobility: Modified Independent             General bed mobility comments: HoB elevated and use of bed rail   Transfers Overall transfer  level: Needs assistance Equipment used: None Transfers: Sit to/from Stand Sit to Stand: Supervision         General transfer comment: for safety and immediate standing balance, increased time/effort but no assist required     Balance Overall balance assessment: Needs assistance Sitting-balance support: Feet supported;No upper extremity supported Sitting balance-Leahy Scale: Normal     Standing balance support: During functional activity;No upper extremity supported Standing balance-Leahy Scale: Fair                             ADL either performed or assessed with clinical judgement   ADL Overall ADL's : Needs assistance/impaired Eating/Feeding: Set up;Sitting   Grooming: Set up;Sitting   Upper Body Bathing: Set up;Supervision/ safety;Sitting   Lower Body Bathing: Min guard;Sit to/from stand   Upper Body Dressing : Set up;Sitting   Lower Body Dressing: Min guard;Minimal assistance;Sit to/from stand   Toilet Transfer: Minimal assistance;Ambulation Toilet Transfer Details (indicate cue type and reason): use of HHA, simulated via room level mobility, transfer EOB to recliner Toileting- Clothing Manipulation and Hygiene: Minimal assistance;Sit to/from stand       Functional mobility during ADLs: Minimal assistance(HHA) General ADL Comments: pt requiring increased assist partly due to unfamiliar environment      Baseline Vision/History: Legally blind Patient Visual Report: No change from baseline Additional Comments: pt typically uses guide cane, can make out some colors/larger shapes (could identify by blue gown, window in door after approaching door)                Pertinent Vitals/Pain Pain Assessment: No/denies pain     Hand  Dominance     Extremity/Trunk Assessment Upper Extremity Assessment Upper Extremity Assessment: Overall WFL for tasks assessed   Lower Extremity Assessment Lower Extremity Assessment: Defer to PT evaluation   Cervical  / Trunk Assessment Cervical / Trunk Assessment: Normal   Communication Communication Communication: No difficulties   Cognition Arousal/Alertness: Awake/alert Behavior During Therapy: WFL for tasks assessed/performed Overall Cognitive Status: Within Functional Limits for tasks assessed                                     General Comments  VSS on RA    Exercises     Shoulder Instructions      Home Living Family/patient expects to be discharged to:: Private residence Living Arrangements: Alone Available Help at Discharge: Family;Friend(s);Available 24 hours/day Type of Home: Apartment Home Access: Level entry     Home Layout: One level     Bathroom Shower/Tub: Producer, television/film/video: Standard Bathroom Accessibility: Yes   Home Equipment: (guide cane)          Prior Functioning/Environment Level of Independence: Independent        Comments: works with [employer] for [employer], pharmacy sets up pill box for him, family assists with driving, drive pt to work        OT Problem List: Impaired vision/perception;Decreased activity tolerance;Cardiopulmonary status limiting activity;Impaired balance (sitting and/or standing)      OT Treatment/Interventions: Self-care/ADL training;Therapeutic exercise;Energy conservation;DME and/or AE instruction;Therapeutic activities;Visual/perceptual remediation/compensation;Patient/family education;Balance training    OT Goals(Current goals can be found in the care plan section) Acute Rehab OT Goals Patient Stated Goal: feel better OT Goal Formulation: With patient Time For Goal Achievement: 05/10/19 Potential to Achieve Goals: Good ADL Goals Pt Will Perform Grooming: with supervision;standing;sitting Pt Will Perform Lower Body Bathing: with supervision;sitting/lateral leans;sit to/from stand Pt Will Perform Lower Body Dressing: with supervision;sitting/lateral leans;sit to/from stand Pt Will Transfer to  Toilet: with supervision;ambulating Pt Will Perform Toileting - Clothing Manipulation and hygiene: with supervision;sit to/from stand;sitting/lateral leans  OT Frequency: Min 2X/week   Barriers to D/C:            Co-evaluation              AM-PAC OT "6 Clicks" Daily Activity     Outcome Measure Help from another person eating meals?: A Little Help from another person taking care of personal grooming?: A Little Help from another person toileting, which includes using toliet, bedpan, or urinal?: A Little Help from another person bathing (including washing, rinsing, drying)?: A Little Help from another person to put on and taking off regular upper body clothing?: A Little Help from another person to put on and taking off regular lower body clothing?: A Little 6 Click Score: 18   End of Session Equipment Utilized During Treatment: Gait belt Nurse Communication: Mobility status  Activity Tolerance: Patient tolerated treatment well Patient left: in chair;with call bell/phone within reach;with chair alarm set  OT Visit Diagnosis: Low vision, both eyes (H54.2);Muscle weakness (generalized) (M62.81);Other abnormalities of gait and mobility (R26.89)                Time: 2130-8657 OT Time Calculation (min): 26 min Charges:  OT General Charges $OT Visit: 1 Visit OT Evaluation $OT Eval Moderate Complexity: 1 Mod OT Treatments $Self Care/Home Management : 8-22 mins  Marcy Siren, OT [facility] Pager [phone] Office [phone]   Orlando Penner 04/26/2019,  1:39 PM

## 2019-04-26 NOTE — Progress Notes (Signed)
PROGRESS NOTE                                                                                                                                                                                                             Patient Demographics:    [name] [name], is a 74 y.o. male, DOB - [birth year], OVZ:[id number]  Outpatient Primary MD for the patient is Tally Joe, MD   Admit date - 04/23/2019   LOS - 3  Chief Complaint  Patient presents with  . Shortness of Breath  . Weakness  . Anorexia       Brief Narrative: Patient is a 74 y.o. male with PMHx of DM-2, HTN, HLD, legally blind-presented with shortness of breath (got his first dose of Covid vaccine approximately 1 week back)-found to have acute hypoxemic respiratory failure secondary to COVID-19 pneumonia.   Subjective:   Patient in bed, appears comfortable, denies any headache, no fever, no chest pain or pressure, no shortness of breath , no abdominal pain. No focal weakness.   Assessment  & Plan :   Acute Hypoxic Resp Failure due to Covid 19 Viral pneumonia: Improved-titrated to room air this morning.  Continue steroids/remdesivir.  Follow clinical trajectory and inflammatory markers.  Fever: afebrile  O2 requirements:  SpO2: 98 %   COVID-19 Labs: Recent Labs    04/23/19 2238 04/23/19 2238 04/24/19 0732 04/25/19 0147 04/26/19 0408  DDIMER 0.95*   < > 0.83* 0.56* 0.40  FERRITIN 577*  --  652* 681*  --   CRP 9.1*   < > 9.1* 6.2* 2.6*   < > = values in this interval not displayed.       Component Value Date/Time   BNP 92.2 04/26/2019 0408    Recent Labs  Lab 04/23/19 2238  PROCALCITON 0.17    Lab Results  Component Value Date   SARSCOV2NAA POSITIVE (A) 04/23/2019     COVID-19 Medications: Steroids:2/25>> Remdesivir:2/25>>  Prone/Incentive Spirometry: encouraged  incentive spirometry use 3-4/hour.   AKI: Likely hemodynamically  mediated-improving with supportive care.  Continue to hold ARB HCTZ-continue to avoid nephrotoxic agents.  AKI improving.  HTN: Controlled-continue amlodipine.  ARB/HCTZ on hold  HLD: Continue statins  Legally blind  DM-2: CBG stable-SSI.  Oral hypoglycemics on hold.  Recent Labs    04/24/19 1634 04/24/19 2125 04/25/19 0721  GLUCAP  176* 184* 263*     Consults  :  None  Procedures  :  None  Condition -  Guarded  Family Communication  :  Daughter updated over the phone 2/28 by previous me  Code Status :  Full Code  Diet :  Diet Order            Diet heart healthy/carb modified Room service appropriate? Yes; Fluid consistency: Thin  Diet effective now               Disposition Plan  :  Remain hospitalized, Complete 5 days of IV Remdesivir   Antimicorbials  :    Anti-infectives (From admission, onward)   Start     Dose/Rate Route Frequency Ordered Stop   04/24/19 1000  remdesivir 100 mg in sodium chloride 0.9 % 100 mL IVPB     100 mg 200 mL/hr over 30 Minutes Intravenous Daily 04/23/19 2201 04/28/19 0959   04/23/19 2215  remdesivir 200 mg in sodium chloride 0.9% 250 mL IVPB     200 mg 580 mL/hr over 30 Minutes Intravenous Once 04/23/19 2201 04/24/19 0008     DVT prophylaxis.  Lovenox  Inpatient Medications  Scheduled Meds: . amLODipine  10 mg Oral Daily  . atorvastatin  20 mg Oral Daily  . dexamethasone (DECADRON) injection  6 mg Intravenous Q24H  . enoxaparin (LOVENOX) injection  40 mg Subcutaneous Q24H  . insulin aspart  0-9 Units Subcutaneous TID WC  . latanoprost  1 drop Both Eyes Daily   Continuous Infusions: . remdesivir 100 mg in NS 100 mL 100 mg (04/26/19 0934)   PRN Meds:.acetaminophen **OR** acetaminophen, hydrALAZINE, ondansetron **OR** ondansetron (ZOFRAN) IV   Time Spent in minutes  25  See all Orders from today for further details   Lala Lund M.D on 04/26/2019 at 11:36 AM  To page go to [email or web address] - use universal  password  [facility] -  Office  [phone]    Objective:   Vitals:   04/25/19 1117 04/25/19 2052 04/26/19 0409 04/26/19 0923  BP: 116/83 100/72 124/83 120/83  Pulse:   (!) 58 64  Resp:  19 19 17   Temp:   97.7 F (36.5 C)   TempSrc:   Oral   SpO2:   98% 98%  Weight:      Height:        Wt Readings from Last 3 Encounters:  04/24/19 106.6 kg  02/28/16 105.6 kg  02/19/16 105.2 kg     Intake/Output Summary (Last 24 hours) at 04/26/2019 1136 Last data filed at 04/26/2019 0403 Gross per 24 hour  Intake --  Output 1350 ml  Net -1350 ml     Physical Exam  Awake Alert, No new F.N deficits, legally blind [place].AT,PERRAL Supple Neck,No JVD, No cervical lymphadenopathy appriciated.  Symmetrical Chest wall movement, Good air movement bilaterally, CTAB RRR,No Gallops, Rubs or new Murmurs, No Parasternal Heave +ve B.Sounds, Abd Soft, No tenderness, No organomegaly appriciated, No rebound - guarding or rigidity. No Cyanosis, Clubbing or edema, No new Rash or bruise    Data Review:    CBC Recent Labs  Lab 04/23/19 1540 04/23/19 2238 04/24/19 0732 04/25/19 0147 04/26/19 0408  WBC 6.9 6.2 6.0 4.7 4.6  HGB 14.4 13.7 13.1 13.3 13.4  HCT 43.8 40.3 42.6 39.6 40.3  PLT 241 232 272 273 298  MCV 89.2 88.8 96.4 88.2 89.4  MCH 29.3 30.2 29.6 29.6 29.7  MCHC 32.9 34.0 30.8 33.6 33.3  RDW 13.3  13.3 13.6 13.2 13.2  LYMPHSABS  --   --  0.2*  --  0.7  MONOABS  --   --  0.0*  --  0.2  EOSABS  --   --  0.0  --  0.0  BASOSABS  --   --  0.0  --  0.0    Chemistries  Recent Labs  Lab 04/23/19 1540 04/23/19 2238 04/24/19 0732 04/25/19 0147 04/26/19 0408  NA 138  --  135 134* 136  K 3.9  --  4.3 4.2 4.4  CL 106  --  105 102 104  CO2 21*  --  19* 21* 21*  GLUCOSE 179*  --  256* 255* 294*  BUN 23  --  27* 33* 30*  CREATININE 1.83* 1.82* 1.55* 1.28* 1.28*  CALCIUM 8.8*  --  8.2* 8.6* 8.4*  MG  --   --   --   --  2.0  AST  --   --  43* 40 33  ALT  --   --  42 43 41   ALKPHOS  --   --  40 38 35*  BILITOT  --   --  0.7 0.9 1.2   ------------------------------------------------------------------------------------------------------------------ No results for input(s): CHOL, HDL, LDLCALC, TRIG, CHOLHDL, LDLDIRECT in the last 72 hours.  Lab Results  Component Value Date   HGBA1C >15.5 (H) 02/28/2016   ------------------------------------------------------------------------------------------------------------------ Recent Labs    04/26/19 0408  TSH 0.253*   ------------------------------------------------------------------------------------------------------------------ Recent Labs    04/24/19 0732 04/25/19 0147  FERRITIN 652* 681*    Coagulation profile No results for input(s): INR, PROTIME in the last 168 hours.  Recent Labs    04/25/19 0147 04/26/19 0408  DDIMER 0.56* 0.40    Cardiac Enzymes No results for input(s): CKMB, TROPONINI, MYOGLOBIN in the last 168 hours.  Invalid input(s): CK ------------------------------------------------------------------------------------------------------------------    Component Value Date/Time   BNP 92.2 04/26/2019 0408    Micro Results Recent Results (from the past 240 hour(s))  Respiratory Panel by RT PCR (Flu A&B, Covid) - Nasopharyngeal Swab     Status: Abnormal   Collection Time: 04/23/19  7:30 PM   Specimen: Nasopharyngeal Swab  Result Value Ref Range Status   SARS Coronavirus 2 by RT PCR POSITIVE (A) NEGATIVE Final    Comment: RESULT CALLED TO, READ BACK BY AND VERIFIED WITH: H MASHBURN RN 04/23/19 2136 JDW (NOTE) SARS-CoV-2 target nucleic acids are DETECTED. SARS-CoV-2 RNA is generally detectable in upper respiratory specimens  during the acute phase of infection. Positive results are indicative of the presence of the identified virus, but do not rule out bacterial infection or co-infection with other pathogens not detected by the test. Clinical correlation with patient history  and other diagnostic information is necessary to determine patient infection status. The expected result is Negative. Fact Sheet for Patients:  https://www.moore.com/ Fact Sheet for Healthcare Providers: https://www.young.biz/ This test is not yet approved or cleared by the [place] FDA and  has been authorized for detection and/or diagnosis of SARS-CoV-2 by FDA under an Emergency Use Authorization (EUA).  This EUA will remain in effect (meaning this test can be used) for th e duration of  the COVID-19 declaration under Section 564(b)(1) of the Act, 21 U.S.C. section 360bbb-3(b)(1), unless the authorization is terminated or revoked sooner.    Influenza A by PCR NEGATIVE NEGATIVE Final   Influenza B by PCR NEGATIVE NEGATIVE Final    Comment: (NOTE) The Xpert Xpress SARS-CoV-2/FLU/RSV assay is intended as an aid  in  the diagnosis of influenza from Nasopharyngeal swab specimens and  should not be used as a sole basis for treatment. Nasal washings and  aspirates are unacceptable for Xpert Xpress SARS-CoV-2/FLU/RSV  testing. Fact Sheet for Patients: https://www.moore.com/ Fact Sheet for Healthcare Providers: https://www.young.biz/ This test is not yet approved or cleared by the [place] FDA and  has been authorized for detection and/or diagnosis of SARS-CoV-2 by  FDA under an Emergency Use Authorization (EUA). This EUA will remain  in effect (meaning this test can be used) for the duration of the  Covid-19 declaration under Section 564(b)(1) of the Act, 21  U.S.C. section 360bbb-3(b)(1), unless the authorization is  terminated or revoked. Performed at [facility], [address], [place], [place] [address]     Radiology Reports DG Chest Portable 1 View  Result Date: 04/23/2019 CLINICAL DATA:  Shortness of breath EXAM: PORTABLE CHEST 1 VIEW COMPARISON:  02/28/2016 FINDINGS: The heart size  and mediastinal contours are stable. Calcific aortic knob. Interstitial opacities within the bilateral perihilar and bibasilar regions. No pleural effusion or pneumothorax. IMPRESSION: Interstitial opacities within the bilateral perihilar and bibasilar regions. Findings may reflect interstitial edema versus atypical/viral infection. Electronically Signed   By: Duanne Guess D.O.   On: 04/23/2019 16:08

## 2019-04-27 LAB — COMPREHENSIVE METABOLIC PANEL
ALT: 41 U/L (ref 0–44)
AST: 31 U/L (ref 15–41)
Albumin: 2.6 g/dL — ABNORMAL LOW (ref 3.5–5.0)
Alkaline Phosphatase: 36 U/L — ABNORMAL LOW (ref 38–126)
Anion gap: 8 (ref 5–15)
BUN: 27 mg/dL — ABNORMAL HIGH (ref 8–23)
CO2: 24 mmol/L (ref 22–32)
Calcium: 8.6 mg/dL — ABNORMAL LOW (ref 8.9–10.3)
Chloride: 103 mmol/L (ref 98–111)
Creatinine, Ser: 1.23 mg/dL (ref 0.61–1.24)
GFR calc Af Amer: >60 mL/min (ref >60)
GFR calc non Af Amer: 58 mL/min — ABNORMAL LOW (ref >60)
Glucose, Bld: 248 mg/dL — ABNORMAL HIGH (ref 70–99)
Potassium: 4.9 mmol/L (ref 3.5–5.1)
Sodium: 135 mmol/L (ref 135–145)
Total Bilirubin: 1.4 mg/dL — ABNORMAL HIGH (ref 0.3–1.2)
Total Protein: 7.5 g/dL (ref 6.5–8.1)

## 2019-04-27 LAB — CBC WITH DIFFERENTIAL/PLATELET
Abs Immature Granulocytes: 0.02 10*3/uL (ref 0.00–0.07)
Basophils Absolute: 0 10*3/uL (ref 0.0–0.1)
Basophils Relative: 0 %
Eosinophils Absolute: 0 10*3/uL (ref 0.0–0.5)
Eosinophils Relative: 0 %
HCT: 41.8 % (ref 39.0–52.0)
Hemoglobin: 13.8 g/dL (ref 13.0–17.0)
Immature Granulocytes: 0 %
Lymphocytes Relative: 20 %
Lymphs Abs: 0.9 10*3/uL (ref 0.7–4.0)
MCH: 29.5 pg (ref 26.0–34.0)
MCHC: 33 g/dL (ref 30.0–36.0)
MCV: 89.3 fL (ref 80.0–100.0)
Monocytes Absolute: 0.3 10*3/uL (ref 0.1–1.0)
Monocytes Relative: 6 %
Neutro Abs: 3.4 10*3/uL (ref 1.7–7.7)
Neutrophils Relative %: 74 %
Platelets: 318 10*3/uL (ref 150–400)
RBC: 4.68 MIL/uL (ref 4.22–5.81)
RDW: 13.2 % (ref 11.5–15.5)
WBC: 4.5 10*3/uL (ref 4.0–10.5)
nRBC: 0 % (ref 0.0–0.2)

## 2019-04-27 LAB — GLUCOSE, CAPILLARY
Glucose-Capillary: 281 mg/dL — ABNORMAL HIGH (ref 70–99)
Glucose-Capillary: 271 mg/dL — ABNORMAL HIGH (ref 70–99)

## 2019-04-27 LAB — MAGNESIUM: Magnesium: 2 mg/dL (ref 1.7–2.4)

## 2019-04-27 LAB — C-REACTIVE PROTEIN: CRP: 1.3 mg/dL — ABNORMAL HIGH (ref <1.0)

## 2019-04-27 LAB — BRAIN NATRIURETIC PEPTIDE: B Natriuretic Peptide: 62.2 pg/mL (ref 0.0–100.0)

## 2019-04-27 LAB — D-DIMER, QUANTITATIVE: D-Dimer, Quant: 0.42 ug/mL-FEU (ref 0.00–0.50)

## 2019-04-27 MED ORDER — ALBUTEROL SULFATE HFA 108 (90 BASE) MCG/ACT IN AERS: 2.0000 | INHALATION_SPRAY | Freq: Four times a day (QID) | RESPIRATORY_TRACT | 0 refills | Status: AC | PRN

## 2019-04-27 NOTE — Discharge Summary (Signed)
[name] [name] INO:[id number] DOB: Oct 30, 1945 DOA: 04/23/2019  PCP: Antony Contras, MD  Admit date: 04/23/2019  Discharge date: 04/27/2019  Admitted From: HomeDisposition:  Home   Recommendations for Outpatient Follow-up:   Follow up with PCP in 1-2 weeks  PCP Please obtain BMP/CBC, 2 view CXR in 1week,  (see Discharge instructions)   PCP Please follow up on the following pending results:    Home Health: PT,RN   Equipment/Devices: None  Consultations: None  Discharge Condition: Stable    CODE STATUS: Full    Diet Recommendation: Heart Healthy  Chief Complaint  Patient presents with  . Shortness of Breath  . Weakness  . Anorexia     Brief history of present illness from the day of admission and additional interim summary    Patient is a 74 y.o. male with PMHx of DM-2, HTN, HLD, legally blind-presented with shortness of breath (got his first dose of Covid vaccine approximately 1 week back)-found to have acute hypoxemic respiratory failure secondary to COVID-19 pneumonia.                                                                 Hospital Course   Acute Hypoxic Resp Failure due to Covid 19 Viral pneumonia: He had mild to moderate disease was treated with IV steroids and remdesivir, has finished his treatment today, stable and symptom-free on room air will be discharged home as per family request.  Follow with PCP in a week.    SpO2: 99 %  Recent Labs  Lab 04/23/19 1930 04/23/19 2202 04/23/19 2238 04/24/19 0732 04/25/19 0147 04/26/19 0408 04/27/19 0321  CRP  --   --  9.1* 9.1* 6.2* 2.6* 1.3*  DDIMER  --   --  0.95* 0.83* 0.56* 0.40 0.42  FERRITIN  --   --  577* 652* 681*  --   --   BNP  --  60.0  --   --  71.0 92.2 62.2  PROCALCITON  --   --  0.17  --   --   --   --   SARSCOV2NAA POSITIVE*  --    --   --   --   --   --     Hepatic Function Latest Ref Rng & Units 04/27/2019 04/26/2019 04/25/2019  Total Protein 6.5 - 8.1 g/dL 7.5 7.1 7.3  Albumin 3.5 - 5.0 g/dL 2.6(L) 2.5(L) 2.5(L)  AST 15 - 41 U/L 31 33 40  ALT 0 - 44 U/L 41 41 43  Alk Phosphatase 38 - 126 U/L 36(L) 35(L) 38  Total Bilirubin 0.3 - 1.2 mg/dL 1.4(H) 1.2 0.9     AKI: Likely hemodynamically mediated-improving with supportive care.    Was gently hydrated AKI resolved.  Follow with PCP.  HTN: Controlled-continue home meds.  Follow with  PCP.  HLD: Continue statins  Legally blind - supportive care.  Daughter provides good support at home.  DM-2:  Continue home regimen.    Discharge diagnosis     Principal Problem:   Acute respiratory failure due to COVID-19 [facility]) Active Problems:   Hypertension   Diabetes mellitus without complication ([place])   AKI (acute kidney injury) ([place])   Legally blind   ARF (acute renal failure) ([place])    Discharge instructions    Discharge Instructions    Diet - low sodium heart healthy   Complete by: As directed    Discharge instructions   Complete by: As directed    Follow with Primary MD Antony Contras, MD in 7 days   Get CBC, CMP, 2 view Chest X ray -  checked next visit within 1 week by Primary MD    Activity: As tolerated with Full fall precautions use walker/cane & assistance as needed  Disposition Home    Diet: Heart Healthy    Special Instructions: If you have smoked or chewed Tobacco  in the last 2 yrs please stop smoking, stop any regular Alcohol  and or any Recreational drug use.  On your next visit with your primary care physician please Get Medicines reviewed and adjusted.  Please request your Prim.MD to go over all Hospital Tests and Procedure/Radiological results at the follow up, please get all Hospital records sent to your Prim MD by signing hospital release before you go home.  If you experience worsening of your admission symptoms, develop shortness  of breath, life threatening emergency, suicidal or homicidal thoughts you must seek medical attention immediately by calling 911 or calling your MD immediately  if symptoms less severe.  You Must read complete instructions/literature along with all the possible adverse reactions/side effects for all the Medicines you take and that have been prescribed to you. Take any new Medicines after you have completely understood and accpet all the possible adverse reactions/side effects.   Increase activity slowly   Complete by: As directed    MyChart COVID-19 home monitoring program   Complete by: Apr 27, 2019    Is the patient willing to use the [facility] for home monitoring?: Yes   Temperature monitoring   Complete by: Apr 27, 2019    After how many days would you like to receive a notification of this patient's flowsheet entries?: 1      Discharge Medications   Allergies as of 04/27/2019   No Known Allergies     Medication List    TAKE these medications   albuterol 108 (90 Base) MCG/ACT inhaler Commonly known as: VENTOLIN HFA Inhale 2 puffs into the lungs every 6 (six) hours as needed for wheezing or shortness of breath.   amLODipine 10 MG tablet Commonly known as: NORVASC Take 10 mg by mouth daily.   atorvastatin 20 MG tablet Commonly known as: LIPITOR Take 20 mg by mouth daily.   hydrochlorothiazide 12.5 MG tablet Commonly known as: HYDRODIURIL Take 12.5 mg by mouth every morning.   irbesartan-hydrochlorothiazide 300-12.5 MG tablet Commonly known as: AVALIDE Take 1 tablet by mouth daily.   Jardiance 10 MG Tabs tablet Generic drug: empagliflozin Take 10 mg by mouth every morning.   latanoprost 0.005 % ophthalmic solution Commonly known as: XALATAN Place 1 drop into both eyes daily.   metFORMIN 1000 MG tablet Commonly known as: GLUCOPHAGE Take 1,000 mg by mouth 2 (two) times daily.  Durable Medical Equipment  (From admission, onward)          Start     Ordered   04/26/19 0756  For home use only DME 4 wheeled rolling walker with seat  Once    Question:  Patient needs a walker to treat with the following condition  Answer:  Weakness   04/26/19 0755          Follow-up Information    Antony Contras, MD. Schedule an appointment as soon as possible for a visit in 1 week(s).   Specialty: Family Medicine Contact information: [address], [place] [address] [phone]           Major procedures and Radiology Reports - PLEASE review detailed and final reports thoroughly  -        DG Chest Portable 1 View  Result Date: 04/23/2019 CLINICAL DATA:  Shortness of breath EXAM: PORTABLE CHEST 1 VIEW COMPARISON:  02/28/2016 FINDINGS: The heart size and mediastinal contours are stable. Calcific aortic knob. Interstitial opacities within the bilateral perihilar and bibasilar regions. No pleural effusion or pneumothorax. IMPRESSION: Interstitial opacities within the bilateral perihilar and bibasilar regions. Findings may reflect interstitial edema versus atypical/viral infection. Electronically Signed   By: Davina Poke D.O.   On: 04/23/2019 16:08    Micro Results     Recent Results (from the past 240 hour(s))  Respiratory Panel by RT PCR (Flu A&B, Covid) - Nasopharyngeal Swab     Status: Abnormal   Collection Time: 04/23/19  7:30 PM   Specimen: Nasopharyngeal Swab  Result Value Ref Range Status   SARS Coronavirus 2 by RT PCR POSITIVE (A) NEGATIVE Final    Comment: RESULT CALLED TO, READ BACK BY AND VERIFIED WITH: H MASHBURN RN 04/23/19 2136 JDW (NOTE) SARS-CoV-2 target nucleic acids are DETECTED. SARS-CoV-2 RNA is generally detectable in upper respiratory specimens  during the acute phase of infection. Positive results are indicative of the presence of the identified virus, but do not rule out bacterial infection or co-infection with other pathogens not detected by the test. Clinical correlation with  patient history and other diagnostic information is necessary to determine patient infection status. The expected result is Negative. Fact Sheet for Patients:  PinkCheek.be Fact Sheet for Healthcare Providers: GravelBags.it This test is not yet approved or cleared by the [place] FDA and  has been authorized for detection and/or diagnosis of SARS-CoV-2 by FDA under an Emergency Use Authorization (EUA).  This EUA will remain in effect (meaning this test can be used) for th e duration of  the COVID-19 declaration under Section 564(b)(1) of the Act, 21 U.S.C. section 360bbb-3(b)(1), unless the authorization is terminated or revoked sooner.    Influenza A by PCR NEGATIVE NEGATIVE Final   Influenza B by PCR NEGATIVE NEGATIVE Final    Comment: (NOTE) The Xpert Xpress SARS-CoV-2/FLU/RSV assay is intended as an aid in  the diagnosis of influenza from Nasopharyngeal swab specimens and  should not be used as a sole basis for treatment. Nasal washings and  aspirates are unacceptable for Xpert Xpress SARS-CoV-2/FLU/RSV  testing. Fact Sheet for Patients: PinkCheek.be Fact Sheet for Healthcare Providers: GravelBags.it This test is not yet approved or cleared by the [place] FDA and  has been authorized for detection and/or diagnosis of SARS-CoV-2 by  FDA under an Emergency Use Authorization (EUA). This EUA will remain  in effect (meaning this test can be used) for the duration of the  Covid-19 declaration under Section  564(b)(1) of the Act, 21  U.S.C. section 360bbb-3(b)(1), unless the authorization is  terminated or revoked. Performed at [facility], [place] [address], [place], [place] [address]     Today   Subjective    [name] [name] today has no headache,no chest abdominal pain,no new weakness tingling or numbness, feels much better wants to go home today.       Objective   Blood pressure 124/88, pulse 66, temperature 97.6 F (36.4 C), resp. rate 18, height 6' 3"  (1.905 m), weight 106.6 kg, SpO2 99 %.   Intake/Output Summary (Last 24 hours) at 04/27/2019 1028 Last data filed at 04/27/2019 0300 Gross per 24 hour  Intake 790 ml  Output 1200 ml  Net -410 ml    Exam Awake Alert,   No new F.N deficits, legally blind [place].AT,PERRAL Supple Neck,No JVD, No cervical lymphadenopathy appriciated.  Symmetrical Chest wall movement, Good air movement bilaterally, CTAB RRR,No Gallops,Rubs or new Murmurs, No Parasternal Heave +ve B.Sounds, Abd Soft, Non tender, No organomegaly appriciated, No rebound -guarding or rigidity. No Cyanosis, Clubbing or edema, No new Rash or bruise   Data Review   CBC w Diff:  Lab Results  Component Value Date   WBC 4.5 04/27/2019   HGB 13.8 04/27/2019   HCT 41.8 04/27/2019   PLT 318 04/27/2019   LYMPHOPCT 20 04/27/2019   MONOPCT 6 04/27/2019   EOSPCT 0 04/27/2019   BASOPCT 0 04/27/2019    CMP:  Lab Results  Component Value Date   NA 135 04/27/2019   K 4.9 04/27/2019   CL 103 04/27/2019   CO2 24 04/27/2019   BUN 27 (H) 04/27/2019   CREATININE 1.23 04/27/2019   PROT 7.5 04/27/2019   ALBUMIN 2.6 (L) 04/27/2019   BILITOT 1.4 (H) 04/27/2019   ALKPHOS 36 (L) 04/27/2019   AST 31 04/27/2019   ALT 41 04/27/2019  .   Total Time in preparing paper work, data evaluation and todays exam - 18 minutes  Lala Lund M.D on 04/27/2019 at 10:28 AM  [facility]   Office  [phone]

## 2019-04-27 NOTE — Progress Notes (Addendum)
Physical Therapy Treatment Note  Patient requires assistance only due to unknown environment and does not have his guide cane as he is legally blind. Anticipate in his own environment patient will be modI. He has his family to assist him as needed. No further PT recommended.    04/27/19 1208  PT Visit Information  Last PT Received On 04/27/19  Assistance Needed +1 (due to legally blind and unknown environment)  History of Present Illness 74 year old male admitted 04/23/19 for increasing SOB with nonproductive cough and loose stools along with wekaness. Received his first dose of COVID vaccine last week. SpO2 88-92% in ED. COVID positive. CXR: bilat infiltrates. Admitted with COVID PNA. PMH: legally blind, DM2, HTN, HLD  Subjective Data  Subjective Patient excited to go home today.  Patient Stated Goal to go home  Precautions  Precautions Other (comment)  Precaution Comments legally blind, uses guide cane but does not have in hospital  Pain Assessment  Pain Assessment No/denies pain  Cognition  Arousal/Alertness Awake/alert  Bed Mobility  Overal bed mobility Independent (supine>sit)  Transfers  Overall transfer level Modified independent  Equipment used None  Transfers Sit to/from Stand  Sit to Stand Supervision;Modified independent (Device/Increase time);Min guard  General transfer comment superivsion due to unknown environment, contact guard for approach to chair due to unknown environment and not having guide cane   Ambulation/Gait  Ambulation/Gait assistance Min guard  Gait Distance (Feet) 150 Feet  Assistive device  (patient prefers placing hand on PT's shoulder )  Gait Pattern/deviations Step-through pattern  General Gait Details no LOB, patient notes steadiness on feet, assist provided due to unknown environment and patient does not have guide cane  General Comments  General comments (skin integrity, edema, etc.) on room air, no longer on continuous pulse ox, HR up o 140  bpm with ambulation, RR 24  Exercises  Exercises Other exercises  Other Exercises  Other Exercises flutter x 3 reps (pt declined further reps)  Other Exercises incentive spirometer x 3 reps (pt declined further reps)  PT - End of Session  Activity Tolerance Patient tolerated treatment well  Patient left in chair;with chair alarm set;with call bell/phone within reach  Nurse Communication Mobility status (dtr inquiring about his inhalers)   PT - Assessment/Plan  PT Plan Current plan remains appropriate  PT Visit Diagnosis Other abnormalities of gait and mobility (R26.89)  PT Frequency (ACUTE ONLY) Min 3X/week  Follow Up Recommendations No PT follow up  PT equipment None recommended by PT  AM-PAC PT "6 Clicks" Mobility Outcome Measure (Version 2)  Help needed turning from your back to your side while in a flat bed without using bedrails? 4  Help needed moving from lying on your back to sitting on the side of a flat bed without using bedrails? 4  Help needed moving to and from a bed to a chair (including a wheelchair)? 3  Help needed standing up from a chair using your arms (e.g., wheelchair or bedside chair)? 4  Help needed to walk in hospital room? 3  Help needed climbing 3-5 steps with a railing?  3  6 Click Score 21  Consider Recommendation of Discharge To: Home with no services  PT Goal Progression  Progress towards PT goals Progressing toward goals  PT Time Calculation  PT Start Time (ACUTE ONLY) 1208  PT Stop Time (ACUTE ONLY) 1229  PT Time Calculation (min) (ACUTE ONLY) 21 min  PT General Charges  $$ ACUTE PT VISIT 1 Visit  PT Treatments  $Therapeutic Activity 8-22 mins   Birdie Hopes, DPT, PT Acute Rehab [phone] office

## 2019-04-27 NOTE — Discharge Instructions (Signed)
Follow with Primary MD Tally Joe, MD in 7 days   Get CBC, CMP, 2 view Chest X ray -  checked next visit within 1 week by Primary MD    Activity: As tolerated with Full fall precautions use walker/cane & assistance as needed  Disposition Home    Diet: Heart Healthy    Special Instructions: If you have smoked or chewed Tobacco  in the last 2 yrs please stop smoking, stop any regular Alcohol  and or any Recreational drug use.  On your next visit with your primary care physician please Get Medicines reviewed and adjusted.  Please request your Prim.MD to go over all Hospital Tests and Procedure/Radiological results at the follow up, please get all Hospital records sent to your Prim MD by signing hospital release before you go home.  If you experience worsening of your admission symptoms, develop shortness of breath, life threatening emergency, suicidal or homicidal thoughts you must seek medical attention immediately by calling 911 or calling your MD immediately  if symptoms less severe.  You Must read complete instructions/literature along with all the possible adverse reactions/side effects for all the Medicines you take and that have been prescribed to you. Take any new Medicines after you have completely understood and accpet all the possible adverse reactions/side effects.       Person Under Monitoring Name: [name] [name]  Location: [address]  [place] [facility] [address]   Infection Prevention Recommendations for Individuals Confirmed to have, or Being Evaluated for, 2019 Novel Coronavirus (COVID-19) Infection Who Receive Care at Home  Individuals who are confirmed to have, or are being evaluated for, COVID-19 should follow the prevention steps below until a healthcare provider or local or state health department says they can return to normal activities.  Stay home except to get medical care You should restrict activities outside your home, except for getting medical care.  Do not go to work, school, or public areas, and do not use public transportation or taxis.  Call ahead before visiting your doctor Before your medical appointment, call the healthcare provider and tell them that you have, or are being evaluated for, COVID-19 infection. This will help the healthcare providers office take steps to keep other people from getting infected. Ask your healthcare provider to call the local or state health department.  Monitor your symptoms Seek prompt medical attention if your illness is worsening (e.g., difficulty breathing). Before going to your medical appointment, call the healthcare provider and tell them that you have, or are being evaluated for, COVID-19 infection. Ask your healthcare provider to call the local or state health department.  Wear a facemask You should wear a facemask that covers your nose and mouth when you are in the same room with other people and when you visit a healthcare provider. People who live with or visit you should also wear a facemask while they are in the same room with you.  Separate yourself from other people in your home As much as possible, you should stay in a different room from other people in your home. Also, you should use a separate bathroom, if available.  Avoid sharing household items You should not share dishes, drinking glasses, cups, eating utensils, towels, bedding, or other items with other people in your home. After using these items, you should wash them thoroughly with soap and water.  Cover your coughs and sneezes Cover your mouth and nose with a tissue when you cough or sneeze, or you can  cough or sneeze into your sleeve. Throw used tissues in a lined trash can, and immediately wash your hands with soap and water for at least 20 seconds or use an alcohol-based hand rub.  Wash your [facility] your hands often and thoroughly with soap and water for at least 20 seconds. You can use an alcohol-based  hand sanitizer if soap and water are not available and if your hands are not visibly dirty. Avoid touching your eyes, nose, and mouth with unwashed hands.   Prevention Steps for Caregivers and Household Members of Individuals Confirmed to have, or Being Evaluated for, COVID-19 Infection Being Cared for in the Home  If you live with, or provide care at home for, a person confirmed to have, or being evaluated for, COVID-19 infection please follow these guidelines to prevent infection:  Follow healthcare providers instructions Make sure that you understand and can help the patient follow any healthcare provider instructions for all care.  Provide for the patients basic needs You should help the patient with basic needs in the home and provide support for getting groceries, prescriptions, and other personal needs.  Monitor the patients symptoms If they are getting sicker, call his or her medical provider and tell them that the patient has, or is being evaluated for, COVID-19 infection. This will help the healthcare providers office take steps to keep other people from getting infected. Ask the healthcare provider to call the local or state health department.  Limit the number of people who have contact with the patient  If possible, have only one caregiver for the patient.  Other household members should stay in another home or place of residence. If this is not possible, they should stay  in another room, or be separated from the patient as much as possible. Use a separate bathroom, if available.  Restrict visitors who do not have an essential need to be in the home.  Keep older adults, very young children, and other sick people away from the patient Keep older adults, very young children, and those who have compromised immune systems or chronic health conditions away from the patient. This includes people with chronic heart, lung, or kidney conditions, diabetes, and  cancer.  Ensure good ventilation Make sure that shared spaces in the home have good air flow, such as from an air conditioner or an opened window, weather permitting.  Wash your hands often  Wash your hands often and thoroughly with soap and water for at least 20 seconds. You can use an alcohol based hand sanitizer if soap and water are not available and if your hands are not visibly dirty.  Avoid touching your eyes, nose, and mouth with unwashed hands.  Use disposable paper towels to dry your hands. If not available, use dedicated cloth towels and replace them when they become wet.  Wear a facemask and gloves  Wear a disposable facemask at all times in the room and gloves when you touch or have contact with the patients blood, body fluids, and/or secretions or excretions, such as sweat, saliva, sputum, nasal mucus, vomit, urine, or feces.  Ensure the mask fits over your nose and mouth tightly, and do not touch it during use.  Throw out disposable facemasks and gloves after using them. Do not reuse.  Wash your hands immediately after removing your facemask and gloves.  If your personal clothing becomes contaminated, carefully remove clothing and launder. Wash your hands after handling contaminated clothing.  Place all used disposable facemasks, gloves, and  other waste in a lined container before disposing them with other household waste.  Remove gloves and wash your hands immediately after handling these items.  Do not share dishes, glasses, or other household items with the patient  Avoid sharing household items. You should not share dishes, drinking glasses, cups, eating utensils, towels, bedding, or other items with a patient who is confirmed to have, or being evaluated for, COVID-19 infection.  After the person uses these items, you should wash them thoroughly with soap and water.  Wash laundry thoroughly  Immediately remove and wash clothes or bedding that have blood, body  fluids, and/or secretions or excretions, such as sweat, saliva, sputum, nasal mucus, vomit, urine, or feces, on them.  Wear gloves when handling laundry from the patient.  Read and follow directions on labels of laundry or clothing items and detergent. In general, wash and dry with the warmest temperatures recommended on the label.  Clean all areas the individual has used often  Clean all touchable surfaces, such as counters, tabletops, doorknobs, bathroom fixtures, toilets, phones, keyboards, tablets, and bedside tables, every day. Also, clean any surfaces that may have blood, body fluids, and/or secretions or excretions on them.  Wear gloves when cleaning surfaces the patient has come in contact with.  Use a diluted bleach solution (e.g., dilute bleach with 1 part bleach and 10 parts water) or a household disinfectant with a label that says EPA-registered for coronaviruses. To make a bleach solution at home, add 1 tablespoon of bleach to 1 quart (4 cups) of water. For a larger supply, add  cup of bleach to 1 gallon (16 cups) of water.  Read labels of cleaning products and follow recommendations provided on product labels. Labels contain instructions for safe and effective use of the cleaning product including precautions you should take when applying the product, such as wearing gloves or eye protection and making sure you have good ventilation during use of the product.  Remove gloves and wash hands immediately after cleaning.  Monitor yourself for signs and symptoms of illness Caregivers and household members are considered close contacts, should monitor their health, and will be asked to limit movement outside of the home to the extent possible. Follow the monitoring steps for close contacts listed on the symptom monitoring form.   ? If you have additional questions, contact your local health department or call the epidemiologist on call at [phone] (available 24/7). ? This  guidance is subject to change. For the most up-to-date guidance from [facility], please refer to their website: [email or web address]

## 2019-04-28 LAB — T3: T3, Total: 86 ng/dL (ref 71–180)

## 2019-05-13 ENCOUNTER — Ambulatory Visit: Payer: Medicare Other | Attending: Internal Medicine

## 2019-05-13 DIAGNOSIS — Z23 Encounter for immunization: Secondary | ICD-10-CM

## 2019-05-13 NOTE — Progress Notes (Signed)
   Covid-19 Vaccination Clinic  Name:  [name] [name]    MRN: [id number] DOB: [birth year]  05/13/2019  [name] was observed post Covid-19 immunization for 30 minutes based on pre-vaccination screening without incident. He was provided with Vaccine Information Sheet and instruction to access the V-Safe system.   [name] was instructed to call 911 with any severe reactions post vaccine: Marland Kitchen Difficulty breathing  . Swelling of face and throat  . A fast heartbeat  . A bad rash all over body  . Dizziness and weakness   Immunizations Administered    Name Date Dose VIS Date Route   Moderna COVID-19 Vaccine 05/13/2019 11:07 AM 0.5 mL 01/27/2019 Intramuscular   Manufacturer: Moderna   Lot: [id number]   NDC: 87215-872-76

## 2019-10-26 ENCOUNTER — Encounter (HOSPITAL_COMMUNITY): Payer: Self-pay

## 2019-10-26 ENCOUNTER — Ambulatory Visit (HOSPITAL_COMMUNITY)
Admission: RE | Admit: 2019-10-26 | Discharge: 2019-10-26 | Disposition: A | Discharge status: Home / Self Care | Payer: Medicare Other | Source: Ambulatory Visit | Attending: Internal Medicine | Admitting: Internal Medicine

## 2019-10-26 ENCOUNTER — Other Ambulatory Visit: Payer: Self-pay

## 2019-10-26 VITALS — BP 157/97 | HR 107 | Temp 98.9°F | Resp 17

## 2019-10-26 DIAGNOSIS — Z20822 Contact with and (suspected) exposure to covid-19: Secondary | ICD-10-CM | POA: Diagnosis not present

## 2019-10-26 NOTE — Discharge Instructions (Signed)

## 2019-10-26 NOTE — ED Triage Notes (Signed)
Pt here for covid test, states daughter tested positive yesterday.

## 2019-10-27 LAB — SARS CORONAVIRUS 2 (TAT 6-24 HRS): SARS Coronavirus 2: NEGATIVE

## 2020-12-20 ENCOUNTER — Ambulatory Visit
Admission: RE | Admit: 2020-12-20 | Discharge: 2020-12-20 | Disposition: A | Discharge status: Home / Self Care | Payer: Medicare Other | Source: Ambulatory Visit | Attending: Family Medicine | Admitting: Family Medicine

## 2020-12-20 ENCOUNTER — Other Ambulatory Visit: Payer: Self-pay | Admitting: Family Medicine

## 2020-12-20 DIAGNOSIS — R634 Abnormal weight loss: Secondary | ICD-10-CM

## 2021-01-03 ENCOUNTER — Ambulatory Visit
Admission: RE | Admit: 2021-01-03 | Discharge: 2021-01-03 | Disposition: A | Discharge status: Home / Self Care | Payer: Medicare Other | Source: Ambulatory Visit | Attending: Family Medicine | Admitting: Family Medicine

## 2021-01-03 DIAGNOSIS — R634 Abnormal weight loss: Secondary | ICD-10-CM

## 2021-10-17 ENCOUNTER — Ambulatory Visit: Payer: Medicare Other | Admitting: Podiatry

## 2021-10-17 DIAGNOSIS — M79675 Pain in left toe(s): Secondary | ICD-10-CM | POA: Diagnosis not present

## 2021-10-17 DIAGNOSIS — M79674 Pain in right toe(s): Secondary | ICD-10-CM | POA: Diagnosis not present

## 2021-10-17 DIAGNOSIS — E118 Type 2 diabetes mellitus with unspecified complications: Secondary | ICD-10-CM

## 2021-10-17 DIAGNOSIS — B351 Tinea unguium: Secondary | ICD-10-CM

## 2021-10-19 NOTE — Progress Notes (Signed)
  Subjective:  Patient ID: [name] [name], male    DOB: [birth year],  MRN: [id number]  Chief Complaint  Patient presents with   Foot Problem    diabetic with bil Onychomycosis    76 y.o. male presents with the above complaint. History confirmed with patient.   Objective:  Physical Exam: warm, good capillary refill, no trophic changes or ulcerative lesions, normal DP and PT pulses, normal monofilament exam, and normal sensory exam. Left Foot: dystrophic yellowed discolored nail plates with subungual debris Right Foot: dystrophic yellowed discolored nail plates with subungual debris   Assessment:   1. Pain due to onychomycosis of toenails of both feet   2. Diabetes mellitus with complication [facility])      Plan:  Patient was evaluated and treated and all questions answered.  Discussed the etiology and treatment options for the condition in detail with the patient. Educated patient on the topical and oral treatment options for mycotic nails. Recommended debridement of the nails today. Sharp and mechanical debridement performed of all painful and mycotic nails today. Nails debrided in length and thickness using a nail nipper to level of comfort. Discussed treatment options including appropriate shoe gear. Follow up as needed for painful nails.   Patient educated on diabetes. Discussed proper diabetic foot care and discussed risks and complications of disease. Educated patient in depth on reasons to return to the office immediately should he/she discover anything concerning or new on the feet. All questions answered. Discussed proper shoes as well.    No follow-ups on file.

## 2022-01-30 ENCOUNTER — Ambulatory Visit (INDEPENDENT_AMBULATORY_CARE_PROVIDER_SITE_OTHER): Payer: Medicare Other | Admitting: Podiatry

## 2022-01-30 DIAGNOSIS — Z91199 Patient's noncompliance with other medical treatment and regimen due to unspecified reason: Secondary | ICD-10-CM

## 2022-01-30 NOTE — Progress Notes (Signed)
1. No-show for appointment     

## 2022-02-04 ENCOUNTER — Encounter (HOSPITAL_COMMUNITY): Payer: Self-pay | Admitting: *Deleted

## 2022-02-04 ENCOUNTER — Ambulatory Visit (HOSPITAL_COMMUNITY)
Admission: EM | Admit: 2022-02-04 | Discharge: 2022-02-04 | Disposition: A | Discharge status: Home / Self Care | Payer: Medicare Other | Attending: Family Medicine | Admitting: Family Medicine

## 2022-02-04 ENCOUNTER — Other Ambulatory Visit: Payer: Self-pay

## 2022-02-04 DIAGNOSIS — Z1152 Encounter for screening for COVID-19: Secondary | ICD-10-CM | POA: Diagnosis not present

## 2022-02-04 DIAGNOSIS — J069 Acute upper respiratory infection, unspecified: Secondary | ICD-10-CM | POA: Diagnosis not present

## 2022-02-04 LAB — RESP PANEL BY RT-PCR (RSV, FLU A&B, COVID)  RVPGX2
Influenza A by PCR: POSITIVE — AB
Influenza B by PCR: NEGATIVE
Resp Syncytial Virus by PCR: NEGATIVE
SARS Coronavirus 2 by RT PCR: NEGATIVE

## 2022-02-04 NOTE — ED Triage Notes (Signed)
Pt Family reports cough ,chills started on Thursday.

## 2022-02-04 NOTE — Discharge Instructions (Signed)
You were seen today for upper respiratory symptoms.  I have swabbed you for flu, covid and RSV and this should be resulted by later today.  If anything is positive that we can treat we will call and notify you.  In the mean time I recommend rest, increase fluids, and use tylenol and over the counter medications for your symptoms.  Please return if not improving.

## 2022-02-04 NOTE — ED Provider Notes (Signed)
[facility]    CSN: [id number] Arrival date & time: 02/04/22  1007      History   Chief Complaint Chief Complaint  Patient presents with   Cough   Fever   Chills    HPI [name] [name] is a 76 y.o. male.   Patient is here for uri symptoms that started on Thursday (today is day 4).  He started with coughing, lost voice.  He started with chills and fever on Friday.  No runny nose, congestion or drainage.  No body aches, slight headache.  No wheezing or sob.  He did use nyquil.  No known sick contacts.         Past Medical History:  Diagnosis Date   Glaucoma, both eyes    Hyperlipidemia    Hypertension    Legally blind    Syncope    Type II diabetes mellitus (HCC) dx'd in ~ 2014    Patient Active Problem List   Diagnosis Date Noted   Acute respiratory failure with hypoxia (HCC) 04/23/2019   Acute respiratory failure due to COVID-19 [facility]) 04/23/2019   ARF (acute renal failure) (HCC) 04/23/2019   Syncope 02/28/2016   UTI (urinary tract infection) 02/28/2016   Hyponatremia 02/28/2016   AKI (acute kidney injury) (HCC) 02/28/2016   Hypertension    Diabetes mellitus without complication (HCC)    Legally blind    Diabetes mellitus with complication [facility])     Past Surgical History:  Procedure Laterality Date   ANKLE FRACTURE SURGERY Right    with pinning   CATARACT EXTRACTION W/PHACO Left 06/08/2015   Procedure: CATARACT EXTRACTION PHACO AND INTRAOCULAR LENS PLACEMENT (IOC) LEFT EYE;  Surgeon: Chalmers Guest, MD;  Location: [facility] OR;  Service: Ophthalmology;  Laterality: Left;   COLONOSCOPY     EYE SURGERY     FRACTURE SURGERY     GLAUCOMA SURGERY Left 11/2015   "has tube in to let pressure out" (02/28/2016)   MINI SHUNT INSERTION Left 01/07/2013   Procedure: MINI TUBE WITH MITOMYCIN C LEFT EYE ;  Surgeon: Chalmers Guest, MD;  Location: [facility] OR;  Service: Ophthalmology;  Laterality: Left;   TRABECULECTOMY  12/31/2011   Procedure: TRABECULECTOMY;  Surgeon:  Chalmers Guest, MD;  Location: [facility] OR;  Service: Ophthalmology;  Laterality: Left;  REVISED OPERATIVE WOUND POST GLAUCOMA SURGERY   TRABECULECTOMY Left 01/07/2013   Procedure: TRABECULECTOMY;  Surgeon: Chalmers Guest, MD;  Location: [facility] OR;  Service: Ophthalmology;  Laterality: Left;       Home Medications    Prior to Admission medications   Medication Sig Start Date End Date Taking? Authorizing Provider  albuterol (VENTOLIN HFA) 108 (90 Base) MCG/ACT inhaler Inhale 2 puffs into the lungs every 6 (six) hours as needed for wheezing or shortness of breath. 04/27/19   Leroy Sea, MD  amLODipine (NORVASC) 10 MG tablet Take 10 mg by mouth daily. 03/30/19   [provider]  atorvastatin (LIPITOR) 20 MG tablet Take 20 mg by mouth daily. 05/23/15   [provider]  hydrochlorothiazide (HYDRODIURIL) 12.5 MG tablet Take 12.5 mg by mouth every morning. 03/30/19   [provider]  irbesartan-hydrochlorothiazide (AVALIDE) 300-12.5 MG tablet Take 1 tablet by mouth daily. 04/23/19   [provider]  JARDIANCE 10 MG TABS tablet Take 10 mg by mouth every morning. 04/01/19   [provider]  latanoprost (XALATAN) 0.005 % ophthalmic solution Place 1 drop into both eyes daily. 04/23/19   [provider]  metFORMIN (GLUCOPHAGE)  1000 MG tablet Take 1,000 mg by mouth 2 (two) times daily. 11/29/15   [provider]    Family History Family History  Problem Relation Age of Onset   Diabetes Mother     Social History Social History   Tobacco Use   Smoking status: Never   Smokeless tobacco: Never  Substance Use Topics   Alcohol use: No   Drug use: No     Allergies   Patient has no known allergies.   Review of Systems Review of Systems  Constitutional:  Positive for fever.  HENT:  Negative for congestion and rhinorrhea.   Respiratory:  Positive for cough. Negative for shortness of breath and wheezing.   Gastrointestinal: Negative.    Genitourinary: Negative.   Musculoskeletal: Negative.   Hematological: Negative.   Psychiatric/Behavioral: Negative.       Physical Exam Triage Vital Signs ED Triage Vitals  Enc Vitals Group     BP 02/04/22 1059 122/81     Pulse Rate 02/04/22 1059 77     Resp 02/04/22 1059 20     Temp 02/04/22 1059 98.8 F (37.1 C)     Temp src --      SpO2 02/04/22 1059 95 %     Weight --      Height --      Head Circumference --      Peak Flow --      Pain Score 02/04/22 1057 0     Pain Loc --      Pain Edu? --      Excl. in [place]? --    No data found.  Updated Vital Signs BP 122/81   Pulse 77   Temp 98.8 F (37.1 C)   Resp 20   SpO2 95%   Visual Acuity Right Eye Distance:   Left Eye Distance:   Bilateral Distance:    Right Eye Near:   Left Eye Near:    Bilateral Near:     Physical Exam Constitutional:      Appearance: Normal appearance.  HENT:     Head: Normocephalic and atraumatic.     Nose: Nose normal. No congestion or rhinorrhea.  Cardiovascular:     Rate and Rhythm: Normal rate and regular rhythm.  Pulmonary:     Effort: Pulmonary effort is normal.     Breath sounds: Normal breath sounds.  Musculoskeletal:        General: Normal range of motion.     Cervical back: Normal range of motion and neck supple. No tenderness.  Lymphadenopathy:     Cervical: No cervical adenopathy.  Skin:    General: Skin is warm.  Neurological:     General: No focal deficit present.     Mental Status: He is alert.  Psychiatric:        Mood and Affect: Mood normal.      UC Treatments / Results  Labs (all labs ordered are listed, but only abnormal results are displayed) Labs Reviewed  RESP PANEL BY RT-PCR (RSV, FLU A&B, COVID)  [id number]    EKG   Radiology No results found.  Procedures Procedures (including critical care time)  Medications Ordered in UC Medications - No data to display  Initial Impression / Assessment and Plan / UC Course  I have reviewed the  triage vital signs and the nursing notes.  Pertinent labs & imaging results that were available during my care of the patient were reviewed by me and considered in my medical decision  making (see chart for details).   Patient was seen today for URI symptoms. Exam is normal at this time.  Swab was done and if positive he should be treated with tamiflu or molnupiravir if needed.   Final diagnoses:  Acute upper respiratory infection     Discharge Instructions      You were seen today for upper respiratory symptoms.  I have swabbed you for flu, covid and RSV and this should be resulted by later today.  If anything is positive that we can treat we will call and notify you.  In the mean time I recommend rest, increase fluids, and use tylenol and over the counter medications for your symptoms.  Please return if not improving.     ED Prescriptions   None    PDMP not reviewed this encounter.   Rondel Oh, MD 02/04/22 1115

## 2022-04-25 ENCOUNTER — Encounter: Payer: Self-pay | Admitting: Podiatry

## 2022-04-25 ENCOUNTER — Ambulatory Visit (INDEPENDENT_AMBULATORY_CARE_PROVIDER_SITE_OTHER): Payer: Medicare Other | Admitting: Podiatry

## 2022-04-25 DIAGNOSIS — E118 Type 2 diabetes mellitus with unspecified complications: Secondary | ICD-10-CM | POA: Diagnosis not present

## 2022-04-25 DIAGNOSIS — B351 Tinea unguium: Secondary | ICD-10-CM | POA: Diagnosis not present

## 2022-04-25 DIAGNOSIS — M79674 Pain in right toe(s): Secondary | ICD-10-CM | POA: Diagnosis not present

## 2022-04-25 DIAGNOSIS — M79675 Pain in left toe(s): Secondary | ICD-10-CM

## 2022-04-26 ENCOUNTER — Ambulatory Visit: Payer: Medicare Other | Admitting: Podiatry

## 2022-04-26 NOTE — Progress Notes (Signed)
  Subjective:  Patient ID: [name] [name], male    DOB: September 10, 1945,  MRN: [id number]  Chief Complaint  Patient presents with   Nail Problem    Thick painful toenails, 3 month follow up    77 y.o. male presents with the above complaint. History confirmed with patient.  Last debridement was helpful.  He missed his last appointment with [place].  Objective:  Physical Exam: warm, good capillary refill, no trophic changes or ulcerative lesions, normal DP and PT pulses, normal monofilament exam, and normal sensory exam. Left Foot: dystrophic yellowed discolored nail plates with subungual debris Right Foot: dystrophic yellowed discolored nail plates with subungual debris   Assessment:   1. Pain due to onychomycosis of toenails of both feet   2. Diabetes mellitus with complication [facility])      Plan:  Patient was evaluated and treated and all questions answered.  Discussed the etiology and treatment options for the condition in detail with the patient. Educated patient on the topical and oral treatment options for mycotic nails. Recommended debridement of the nails today. Sharp and mechanical debridement performed of all painful and mycotic nails today. Nails debrided in length and thickness using a nail nipper to level of comfort. Discussed treatment options including appropriate shoe gear. Follow up as needed for painful nails.   Patient educated on diabetes. Discussed proper diabetic foot care and discussed risks and complications of disease. Educated patient in depth on reasons to return to the office immediately should he/she discover anything concerning or new on the feet. All questions answered. Discussed proper shoes as well.    Return in about 3 months (around 07/24/2022) for at risk diabetic foot care.

## 2022-07-24 ENCOUNTER — Ambulatory Visit (INDEPENDENT_AMBULATORY_CARE_PROVIDER_SITE_OTHER): Payer: Medicare Other | Admitting: Podiatry

## 2022-07-24 DIAGNOSIS — M79674 Pain in right toe(s): Secondary | ICD-10-CM | POA: Diagnosis not present

## 2022-07-24 DIAGNOSIS — M79675 Pain in left toe(s): Secondary | ICD-10-CM

## 2022-07-24 DIAGNOSIS — B351 Tinea unguium: Secondary | ICD-10-CM | POA: Diagnosis not present

## 2022-07-26 NOTE — Progress Notes (Signed)
  Subjective:  Patient ID: [name] [name], male    DOB: 06/01/1945,  MRN: [id number]  Chief Complaint  Patient presents with   Nail Problem    RM 4 RFC bilateral nail trim 1-5    77 y.o. male presents with the above complaint. History confirmed with patient.  Last debridement was helpful.  Says his blood sugars well-controlled.  No new issues  Objective:  Physical Exam: warm, good capillary refill, no trophic changes or ulcerative lesions, normal DP and PT pulses, normal monofilament exam, and normal sensory exam. Left Foot: dystrophic yellowed discolored nail plates with subungual debris Right Foot: dystrophic yellowed discolored nail plates with subungual debris   Assessment:   1. Pain due to onychomycosis of toenails of both feet      Plan:  Patient was evaluated and treated and all questions answered.  Discussed the etiology and treatment options for the condition in detail with the patient. Educated patient on the topical and oral treatment options for mycotic nails. Recommended debridement of the nails today. Sharp and mechanical debridement performed of all painful and mycotic nails today. Nails debrided in length and thickness using a nail nipper to level of comfort. Discussed treatment options including appropriate shoe gear. Follow up as needed for painful nails.    Return in about 3 months (around 10/24/2022) for at risk diabetic foot care.

## 2022-10-25 ENCOUNTER — Ambulatory Visit: Payer: Medicare Other | Admitting: Podiatry

## 2023-01-09 ENCOUNTER — Ambulatory Visit: Payer: Medicare Other | Admitting: Podiatry

## 2023-01-09 ENCOUNTER — Encounter: Payer: Self-pay | Admitting: Podiatry

## 2023-01-09 DIAGNOSIS — M79674 Pain in right toe(s): Secondary | ICD-10-CM

## 2023-01-09 DIAGNOSIS — M79675 Pain in left toe(s): Secondary | ICD-10-CM | POA: Diagnosis not present

## 2023-01-09 DIAGNOSIS — B351 Tinea unguium: Secondary | ICD-10-CM | POA: Diagnosis not present

## 2023-01-10 NOTE — Progress Notes (Signed)
Subjective:   Patient ID: [name] [name], male   DOB: 77 y.o.   MRN: [id number]   HPI Patient presents with chronic nail disease and caregiver 1-5 both feet that are very thick and impossible for him to cut   ROS      Objective:  Physical Exam  Neurovascular status intact thick yellow brittle nailbeds 1-5 both feet painful     Assessment:  Chronic mycotic nail infection with pain 1-5 both feet     Plan:  Debridement painful nailbeds 1-5 both feet no intra genic bleeding reappoint routine care

## 2023-08-26 ENCOUNTER — Encounter: Payer: Self-pay | Admitting: Podiatry

## 2023-08-26 ENCOUNTER — Ambulatory Visit: Admitting: Podiatry

## 2023-08-26 VITALS — Ht 75.0 in | Wt 235.0 lb

## 2023-08-26 DIAGNOSIS — B351 Tinea unguium: Secondary | ICD-10-CM | POA: Diagnosis not present

## 2023-08-26 DIAGNOSIS — M79674 Pain in right toe(s): Secondary | ICD-10-CM | POA: Diagnosis not present

## 2023-08-26 DIAGNOSIS — M79675 Pain in left toe(s): Secondary | ICD-10-CM

## 2023-08-26 NOTE — Progress Notes (Signed)
 Subjective:   Patient ID: [name] [name], male   DOB: 78 y.o.   MRN: [id number]   HPI Patient presents with thick yellow brittle nailbeds 1-5 both feet with patient who does have site loss.  He presents with caregiver who helps him and he cannot take care of this for him   ROS      Objective:  Physical Exam  Neurovascular status intact with thick yellow brittle nailbeds 1-5 both feet painful when pressed     Assessment:  Chronic mycotic nail infection 1-5 both feet with pain     Plan:  H&P reviewed debridement of nailbeds 1-5 both feet no iatrogenic bleeding reappoint routine care

## 2023-08-27 ENCOUNTER — Other Ambulatory Visit: Payer: Self-pay | Admitting: Family Medicine

## 2023-08-27 ENCOUNTER — Encounter: Payer: Self-pay | Admitting: Physician Assistant

## 2023-08-27 DIAGNOSIS — R413 Other amnesia: Secondary | ICD-10-CM

## 2023-09-07 NOTE — Progress Notes (Signed)
 Assessment/Plan:     [name] [name] is a very pleasant 78 y.o. year old RH male with a history of hypertension, hyperlipidemia, DM2, glaucoma ( blind),  seen today for evaluation of memory loss. MoCA blind today is 8/30.  Etiology is unclear, workup is in progress.  Patient is able to participate on ADLs, continues to work  at [employer].  Discussed starting donepezil  10 mg daily if tolerated, patient agrees to proceed.   Memory Impairment of unclear etiology   MRI brain without contrast to assess for underlying structural abnormality and assess vascular load was ordered by his PCP, will follow the results Check B12, TSH Start Donepezil  10 mg :Take half tablet (5 mg) daily for 2 weeks, then increase to the full tablet at 10 mg daily. Side effects discussed   Recommend good control of cardiovascular risk factors.   Continue to control mood as per PCP [place] up in 6 months   Subjective:    The patient is accompanied by his daughter who supplements  the history.    How long did patient have memory difficulties?  For about 1 years, slowly progressing, but daughter noticed changes for the last 2 months.  Patient reports some difficulty remembering new information, recent conversations, names.  Daughter is concerned that if his memory continues to decline, he will need higher level of care. repeats oneself?  Endorsed by  his daughter Disoriented when walking into a room? Denies    Leaving objects in unusual places?  Denies.   Wandering behavior? Denies.   Any personality changes, or depression, anxiety?  For the last year he is easily irritable and angry which is not typical for him.  Hallucinations or paranoia? Denies.   Seizures? Denies.    Any sleep changes?  Sleeps well. Denies  frequent nightmares or dream reenactment, other REM behavior or sleepwalking   Sleep apnea? Denies.   Any hygiene concerns?  Denies.   Independent of bathing and dressing? Endorsed  Does the  patient need help with medications? Daughter is in charge as he was missing doses   Who is in charge of the finances?Daughter  is in charge     Any changes in appetite?   Denies.     Patient have trouble swallowing?  Denies.   Does the patient cook? No.  Any headaches?  Denies.   Chronic pain? Denies.   Ambulates with difficulty? Denies.  Needs a cane  Recent falls or head injuries? Denies.     Vision changes?  Denies any new issues.  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  Any strokelike symptoms? Denies.   Any tremors? Denies.   Any anosmia? Denies.   Any incontinence of urine?  Endorsed, over the last 6 months attributed to diuretics. Any bowel dysfunction? Denies.      Patient lives alone.   History of heavy alcohol intake? Denies.   History of heavy tobacco use? Denies.   Family history of dementia? Denies   He works at [employer]  Recent labs, June 2025, B12 627, TSH 1.9  No Known Allergies                             Current Outpatient Medications  Medication Instructions   albuterol  (VENTOLIN  HFA) 108 (90 Base) MCG/ACT inhaler 2 puffs, Inhalation, Every 6 hours PRN   amLODipine  (NORVASC ) 10 mg, Daily   atorvastatin  (LIPITOR) 20 mg, Daily   donepezil  (ARICEPT ) 10 MG tablet Take half tablet (5 mg) daily   then increase to the full tablet (10 mg) daily.   hydrochlorothiazide (HYDRODIURIL) 12.5 mg, Every morning   irbesartan-hydrochlorothiazide (AVALIDE) 300-12.5 MG tablet 1 tablet, Daily   Jardiance 10 mg, Every morning   latanoprost  (XALATAN ) 0.005 % ophthalmic solution 1 drop, Daily   metFORMIN  (GLUCOPHAGE ) 1,000 mg, 2 times daily     VITALS:   Vitals:   09/11/23 0737  BP: 138/76  Pulse: 69  Resp: 20  SpO2: 99%  Weight: 203 lb  (92.1 kg)  Height: 6' 3 (1.905 m)     Physical Exam  :    09/11/2023    8:00 AM  Montreal Cognitive Assessment   Attention: Read list of digits (0/2) 1  Attention: Read list of letters (0/1) 1  Attention: Serial 7 subtraction starting at 100 (0/3) 2  Language: Repeat phrase (0/2) 0  Language : Fluency (0/1) 1  Abstraction (0/2) 0  Delayed Recall (0/5) 0  Orientation (0/6) 2        No data to display             HEENT:  Normocephalic, atraumatic.  The superficial temporal arteries are without ropiness or tenderness. Cardiovascular: Regular rate and rhythm. Lungs: Clear to auscultation bilaterally. Neck: There are no carotid bruits noted bilaterally. Orientation:  Alert and oriented to person, place and not to time . No aphasia or dysarthria. Fund of knowledge is appropriate. Recent and remote memory impaired.  Attention and concentration are reduced .  Unable to repeat phrases.  Delayed recall 0  /5 .  Cranial nerves: There is good facial symmetry. Patient is blind. Speech is fluent and clear. Several missing teeth. No tongue deviation. Hearing is intact to conversational tone.  Tone: Tone is good throughout. Sensation: Sensation is intact to light touch.  Vibration is intact at the bilateral big toe.  Coordination: The patient has no difficulty with RAM's or FNF bilaterally. Normal finger to nose  Motor: Strength is 5/5 in the bilateral upper and lower extremities. There is no pronator drift. There are no fasciculations noted. DTR's: Deep tendon reflexes are 2/4 bilaterally. Gait and Station: The patient is able to ambulate without difficulty. Gait is cautious and narrow. Stride length is normal.        Thank you for allowing us  the opportunity to participate in the care of this nice patient. Please do not hesitate to contact us  for any questions or concerns.   Total time spent on today's visit was 60 minutes dedicated to this patient today, preparing to see patient,  examining the patient, ordering tests and/or medications and counseling the patient, documenting clinical information in the EHR or other health record, independently interpreting results and communicating results to the patient/family, discussing treatment and goals, answering patient's questions and coordinating care.  Cc:  Seabron Lenis, MD  Camie Sevin 09/11/2023 8:53 AM

## 2023-09-10 ENCOUNTER — Ambulatory Visit
Admission: RE | Admit: 2023-09-10 | Discharge: 2023-09-10 | Disposition: A | Discharge status: Home / Self Care | Source: Ambulatory Visit | Attending: Family Medicine | Admitting: Family Medicine

## 2023-09-10 DIAGNOSIS — R413 Other amnesia: Secondary | ICD-10-CM

## 2023-09-11 ENCOUNTER — Other Ambulatory Visit

## 2023-09-11 ENCOUNTER — Ambulatory Visit: Admitting: Physician Assistant

## 2023-09-11 ENCOUNTER — Encounter: Payer: Self-pay | Admitting: Physician Assistant

## 2023-09-11 ENCOUNTER — Ambulatory Visit

## 2023-09-11 VITALS — BP 138/76 | HR 69 | Resp 20 | Ht 75.0 in | Wt 203.0 lb

## 2023-09-11 DIAGNOSIS — R413 Other amnesia: Secondary | ICD-10-CM

## 2023-09-11 LAB — VITAMIN B12: Vitamin B-12: 634 pg/mL (ref 200–1100)

## 2023-09-11 LAB — TSH: TSH: 1.3 m[IU]/L (ref 0.40–4.50)

## 2023-09-11 MED ORDER — DONEPEZIL HCL 10 MG PO TABS: ORAL_TABLET | ORAL | 3 refills | Status: AC

## 2023-09-11 NOTE — Patient Instructions (Addendum)
 It was a pleasure to see you today at our office.   Recommendations:    Check labs today   [address] Follow up in 6  months Recommend visiting the website :  Dementia Success Path to better understand some behaviors related to memory loss.      https://[email or web address].org/resource/neuro-rehabilitation-apps-and-games/   RECOMMENDATIONS FOR ALL PATIENTS WITH MEMORY PROBLEMS: 1. Continue to exercise (Recommend 30 minutes of walking everyday, or 3 hours every week) 2. Increase social interactions - continue going to [place] and enjoy social gatherings with friends and family 3. Eat healthy, avoid fried foods and eat more fruits and vegetables 4. Maintain adequate blood pressure, blood sugar, and blood cholesterol level. Reducing the risk of stroke and cardiovascular disease also helps promoting better memory. 5. Avoid stressful situations. Live a simple life and avoid aggravations. Organize your time and prepare for the next day in anticipation. 6. Sleep well, avoid any interruptions of sleep and avoid any distractions in the bedroom that may interfere with adequate sleep quality 7. Avoid sugar, avoid sweets as there is a strong link between excessive sugar intake, diabetes, and cognitive impairment We discussed the Mediterranean diet, which has been shown to help patients reduce the risk of progressive memory disorders and reduces cardiovascular risk. This includes eating fish, eat fruits and green leafy vegetables, nuts like almonds and hazelnuts, walnuts, and also use olive oil. Avoid fast foods and fried foods as much as possible. Avoid sweets and sugar as sugar use has been linked to worsening of memory function.  There is always a concern of gradual progression of memory problems. If this is the case, then we may need to adjust level of care according to patient needs. Support, both to the patient and caregiver, should then be put into place.         FALL PRECAUTIONS: Be cautious  when walking. Scan the area for obstacles that may increase the risk of trips and falls. When getting up in the mornings, sit up at the edge of the bed for a few minutes before getting out of bed. Consider elevating the bed at the head end to avoid drop of blood pressure when getting up. Walk always in a well-lit room (use night lights in the walls). Avoid area rugs or power cords from appliances in the middle of the walkways. Use a walker or a cane if necessary and consider physical therapy for balance exercise. Get your eyesight checked regularly.  FINANCIAL OVERSIGHT: Supervision, especially oversight when making financial decisions or transactions is also recommended.  HOME SAFETY: Consider the safety of the kitchen when operating appliances like stoves, microwave oven, and blender. Consider having supervision and share cooking responsibilities until no longer able to participate in those. Accidents with firearms and other hazards in the house should be identified and addressed as well.   ABILITY TO BE LEFT ALONE: If patient is unable to contact 911 operator, consider using LifeLine, or when the need is there, arrange for someone to stay with patients. Smoking is a fire hazard, consider supervision or cessation. Risk of wandering should be assessed by caregiver and if detected at any point, supervision and safe proof recommendations should be instituted.  MEDICATION SUPERVISION: Inability to self-administer medication needs to be constantly addressed. Implement a mechanism to ensure safe administration of the medications.      Mediterranean Diet A Mediterranean diet refers to food and lifestyle choices that are based on the traditions of countries located on the [employer]. This way  of eating has been shown to help prevent certain conditions and improve outcomes for people who have chronic diseases, like kidney disease and heart disease. What are tips for following this plan? Lifestyle   Cook and eat meals together with your family, when possible. Drink enough fluid to keep your urine clear or pale yellow. Be physically active every day. This includes: Aerobic exercise like running or swimming. Leisure activities like gardening, walking, or housework. Get 7-8 hours of sleep each night. If recommended by your health care provider, drink red wine in moderation. This means 1 glass a day for nonpregnant women and 2 glasses a day for men. A glass of wine equals 5 oz (150 mL). Reading food labels  Check the serving size of packaged foods. For foods such as rice and pasta, the serving size refers to the amount of cooked product, not dry. Check the total fat in packaged foods. Avoid foods that have saturated fat or trans fats. Check the ingredients list for added sugars, such as corn syrup. Shopping  At the grocery store, buy most of your food from the areas near the walls of the store. This includes: Fresh fruits and vegetables (produce). Grains, beans, nuts, and seeds. Some of these may be available in unpackaged forms or large amounts (in bulk). Fresh seafood. Poultry and eggs. Low-fat dairy products. Buy whole ingredients instead of prepackaged foods. Buy fresh fruits and vegetables in-season from local farmers markets. Buy frozen fruits and vegetables in resealable bags. If you do not have access to quality fresh seafood, buy precooked frozen shrimp or canned fish, such as tuna, salmon, or sardines. Buy small amounts of raw or cooked vegetables, salads, or olives from the deli or salad bar at your store. Stock your pantry so you always have certain foods on hand, such as olive oil, canned tuna, canned tomatoes, rice, pasta, and beans. Cooking  Cook foods with extra-virgin olive oil instead of using butter or other vegetable oils. Have meat as a side dish, and have vegetables or grains as your main dish. This means having meat in small portions or adding small amounts of meat  to foods like pasta or stew. Use beans or vegetables instead of meat in common dishes like chili or lasagna. Experiment with different cooking methods. Try roasting or broiling vegetables instead of steaming or sauteing them. Add frozen vegetables to soups, stews, pasta, or rice. Add nuts or seeds for added healthy fat at each meal. You can add these to yogurt, salads, or vegetable dishes. Marinate fish or vegetables using olive oil, lemon juice, garlic, and fresh herbs. Meal planning  Plan to eat 1 vegetarian meal one day each week. Try to work up to 2 vegetarian meals, if possible. Eat seafood 2 or more times a week. Have healthy snacks readily available, such as: Vegetable sticks with hummus. Greek yogurt. Fruit and nut trail mix. Eat balanced meals throughout the week. This includes: Fruit: 2-3 servings a day Vegetables: 4-5 servings a day Low-fat dairy: 2 servings a day Fish, poultry, or lean meat: 1 serving a day Beans and legumes: 2 or more servings a week Nuts and seeds: 1-2 servings a day Whole grains: 6-8 servings a day Extra-virgin olive oil: 3-4 servings a day Limit red meat and sweets to only a few servings a month What are my food choices? Mediterranean diet Recommended Grains: Whole-grain pasta. Brown rice. Bulgar wheat. Polenta. Couscous. Whole-wheat bread. [place]. Vegetables: Artichokes. Beets. Broccoli. Cabbage. Carrots. Eggplant. Green beans. Chard.  Kale. Spinach. Onions. Leeks. Peas. Squash. Tomatoes. Peppers. Radishes. Fruits: Apples. Apricots. Avocado. Berries. Bananas. Cherries. Dates. Figs. Grapes. Lemons. Melon. Oranges. Peaches. Plums. Pomegranate. Meats and other protein foods: Beans. Almonds. Sunflower seeds. Pine nuts. Peanuts. Cod. Salmon. Scallops. Shrimp. Tuna. Tilapia. Clams. Oysters. Eggs. Dairy: Low-fat milk. Cheese. Greek yogurt. Beverages: Water . Red wine. Herbal tea. Fats and oils: Extra virgin olive oil. Avocado oil. Grape seed  oil. Sweets and desserts: [place] yogurt with honey. Baked apples. Poached pears. Trail mix. Seasoning and other foods: Basil. Cilantro. Coriander. Cumin. Mint. Parsley. Sage. Rosemary. Tarragon. Garlic. Oregano. Thyme. Pepper. Balsalmic vinegar. Tahini. Hummus. Tomato sauce. Olives. Mushrooms. Limit these Grains: Prepackaged pasta or rice dishes. Prepackaged cereal with added sugar. Vegetables: Deep fried potatoes (french fries). Fruits: Fruit canned in syrup. Meats and other protein foods: Beef. Pork. Lamb. Poultry with skin. Hot dogs. [place]. Dairy: Ice cream. Sour cream. Whole milk. Beverages: Juice. Sugar-sweetened soft drinks. Beer. Liquor and spirits. Fats and oils: Butter. Canola oil. Vegetable oil. Beef fat (tallow). Lard. Sweets and desserts: Cookies. Cakes. Pies. Candy. Seasoning and other foods: Mayonnaise. Premade sauces and marinades. The items listed may not be a complete list. Talk with your dietitian about what dietary choices are right for you. Summary The Mediterranean diet includes both food and lifestyle choices. Eat a variety of fresh fruits and vegetables, beans, nuts, seeds, and whole grains. Limit the amount of red meat and sweets that you eat. Talk with your health care provider about whether it is safe for you to drink red wine in moderation. This means 1 glass a day for nonpregnant women and 2 glasses a day for men. A glass of wine equals 5 oz (150 mL). This information is not intended to replace advice given to you by your health care provider. Make sure you discuss any questions you have with your health care provider. Document Released: 10/06/2015 Document Revised: 11/08/2015 Document Reviewed: 10/06/2015 Elsevier Interactive Patient Education  2017 [employer].

## 2023-09-12 ENCOUNTER — Ambulatory Visit: Payer: Self-pay | Admitting: Physician Assistant

## 2023-09-17 ENCOUNTER — Telehealth: Payer: Self-pay | Admitting: Physician Assistant

## 2023-09-17 NOTE — Telephone Encounter (Signed)
 Pt.s Daughter Calling for results

## 2023-09-17 NOTE — Telephone Encounter (Signed)
 I advised patient of results, voiced understanding and thanked me for calling.

## 2023-09-17 NOTE — Telephone Encounter (Signed)
Pt. Calling for results

## 2023-09-21 IMAGING — CR DG CHEST 2V
2 series · 2 of 2 positions shown · non-contrast
Comparison: Chest x-ray 04/23/2019.

CLINICAL DATA: 75-year-old male with history of unexplained weight
loss over the past 8 months.

EXAM:
CHEST - 2 VIEW

[w chest pa]
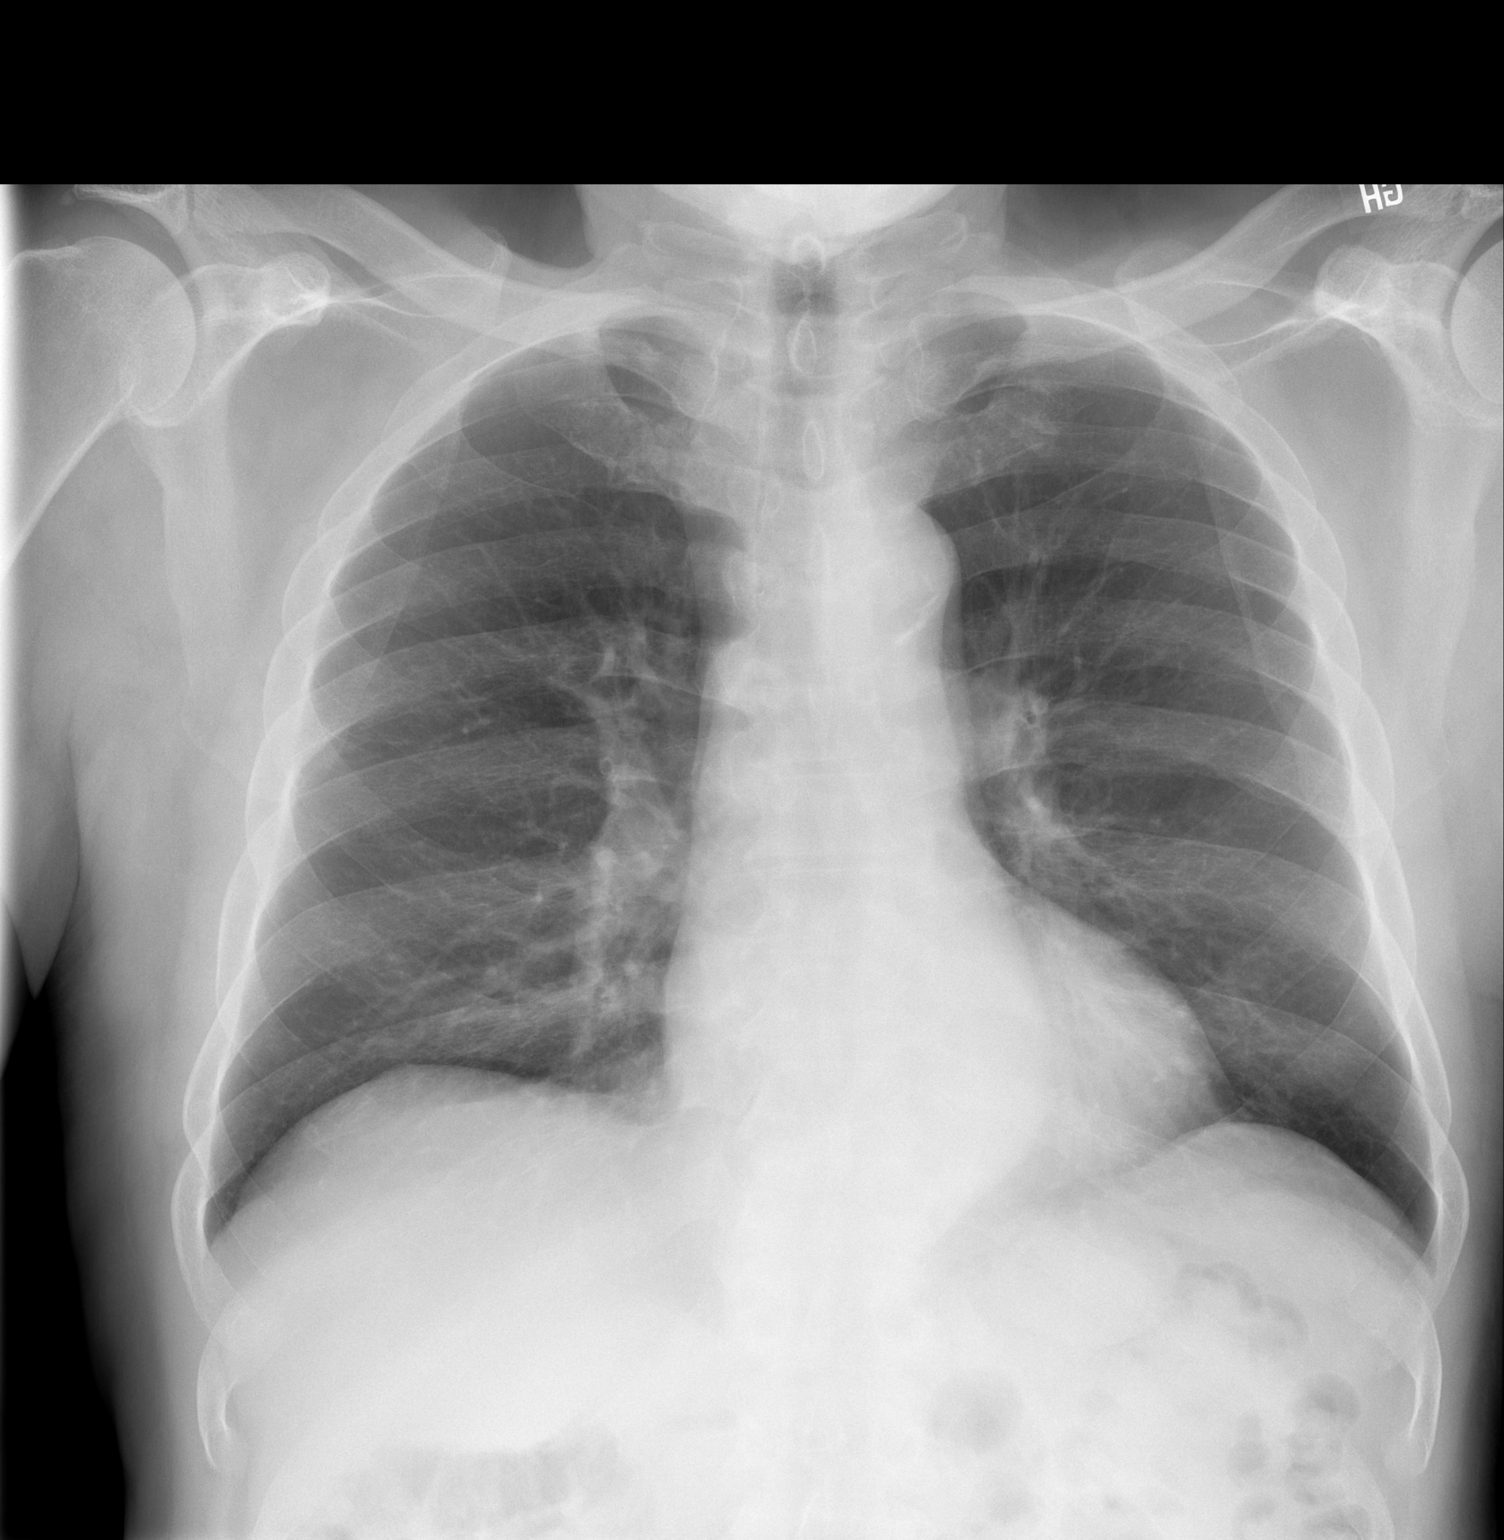

[w chest lat]
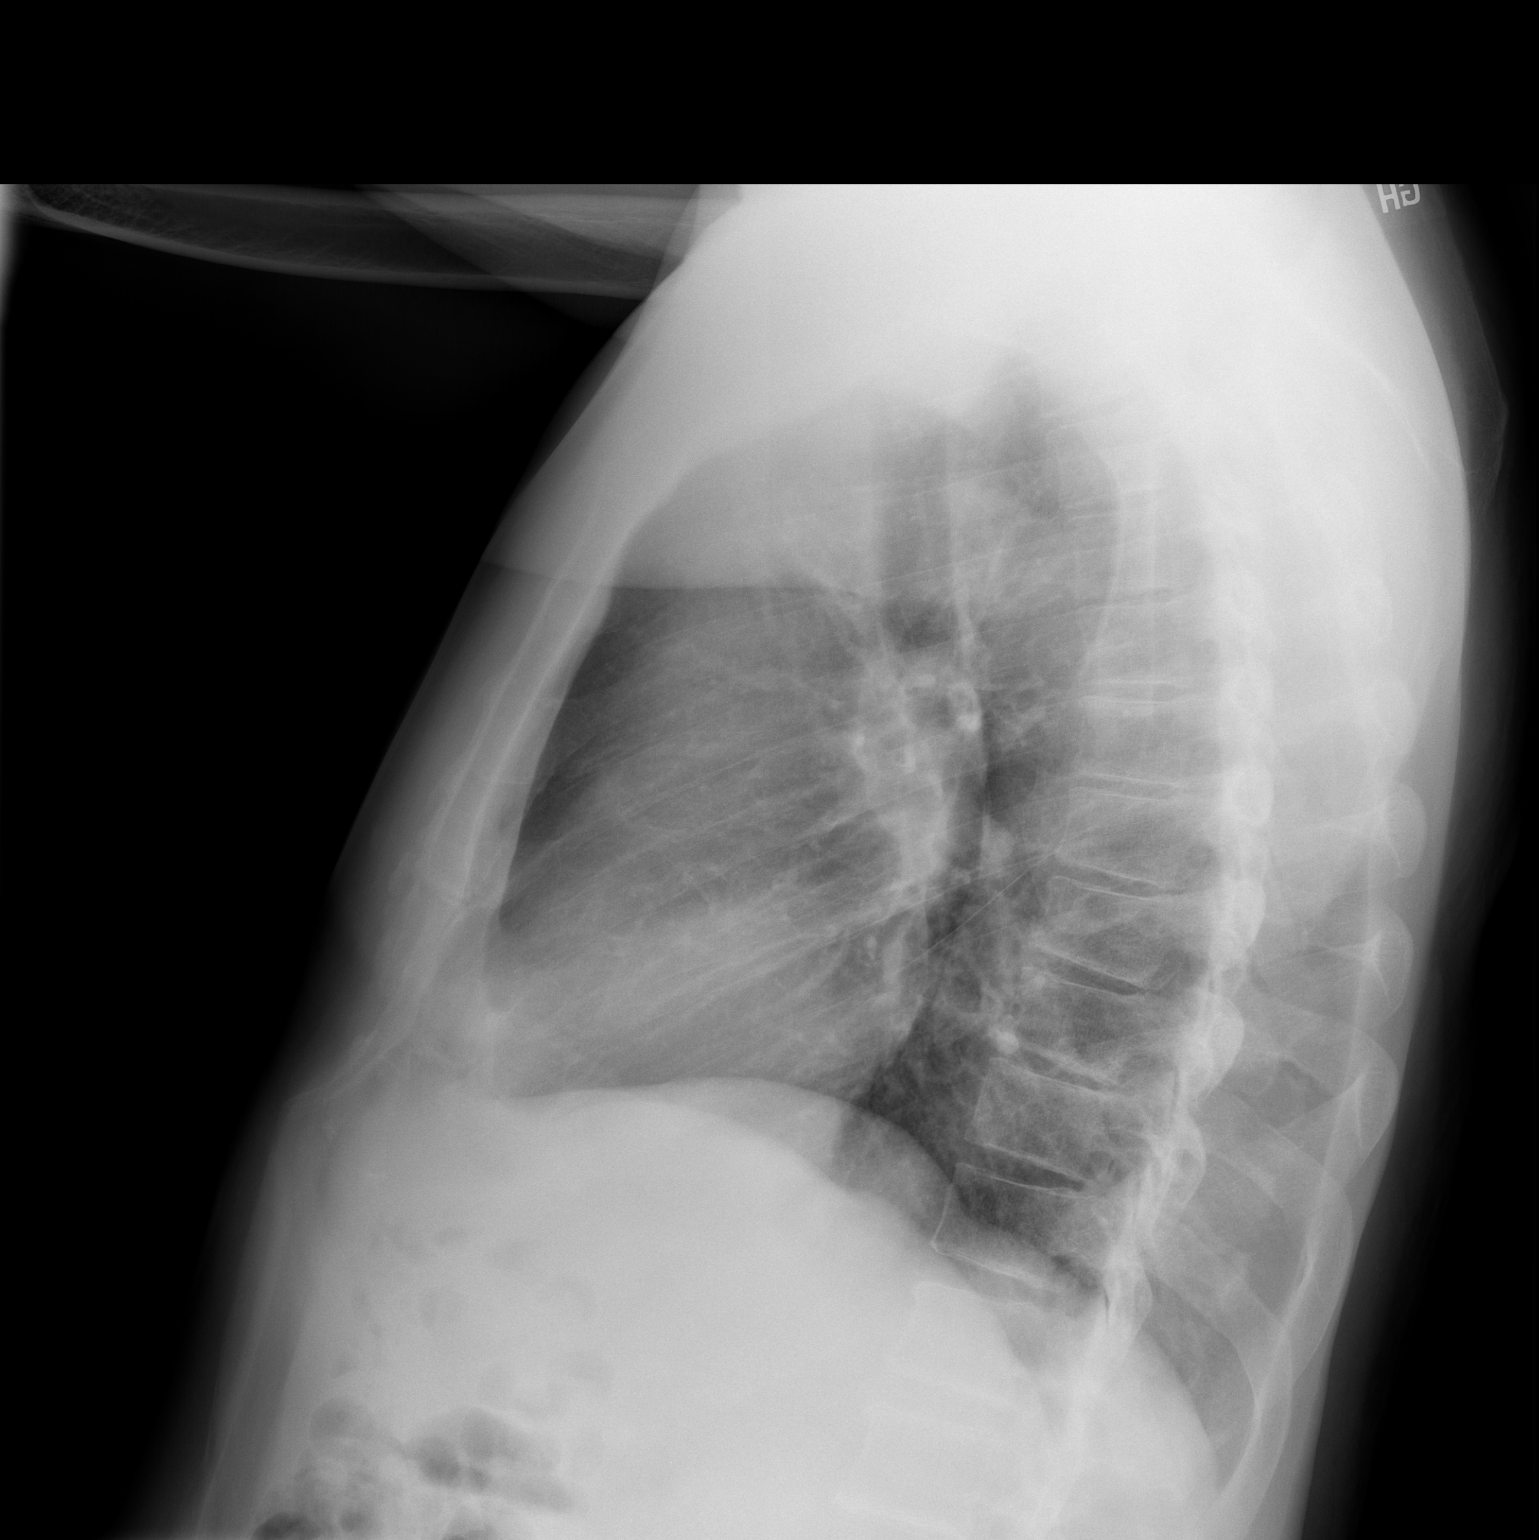

[2 of 2 positions shown; findings below may reference images not displayed]

FINDINGS: Lung volumes are normal. No consolidative airspace disease. No
pleural effusions. No pneumothorax. No pulmonary nodule or mass
noted. Pulmonary vasculature and the cardiomediastinal silhouette
are within normal limits. Atherosclerosis in the thoracic aorta.
IMPRESSION: 1.  No radiographic evidence of acute cardiopulmonary disease.
2. Aortic atherosclerosis.

## 2023-10-05 IMAGING — CT CT ABD-PELV W/O CM
2 of 4 series · 17 of 46 positions shown, 19 images · non-contrast
Comparison: None.

CLINICAL DATA: 30 lb weight loss in past 10 months.

EXAM:
CT ABDOMEN AND PELVIS WITHOUT CONTRAST
TECHNIQUE: Multidetector CT imaging of the abdomen and pelvis was performed
following the standard protocol without IV contrast.

[Series 2: routine abdomen pelvis without 5.00 br40 s3 axial · axial · non-contrast · 0.78mm/px · z∈[+1161,+1581]mm · 14 of 94 slices shown, 16 images]
[im 5/94  soft-tissue]
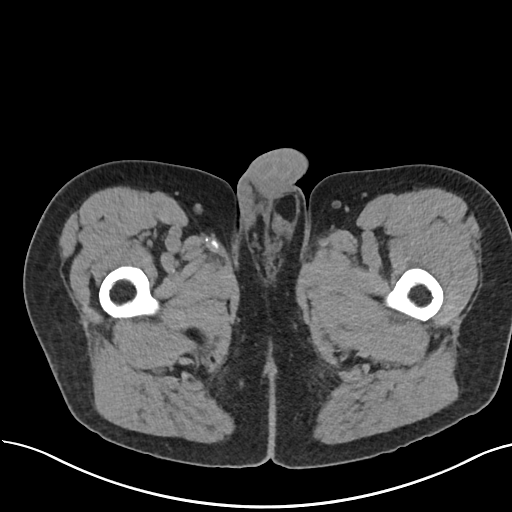
[im 5/94  bone]
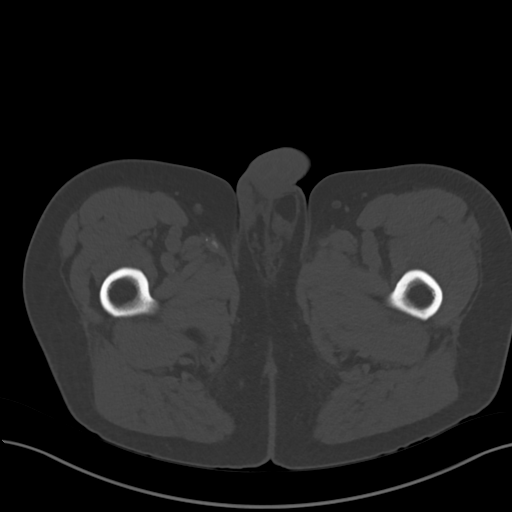
[im 13/94  soft-tissue]
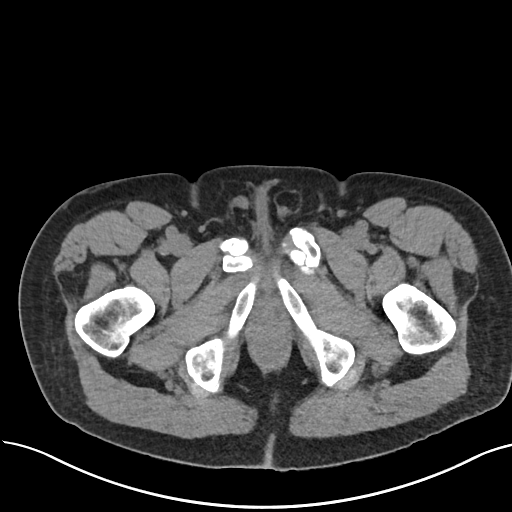
[im 17/94  soft-tissue]
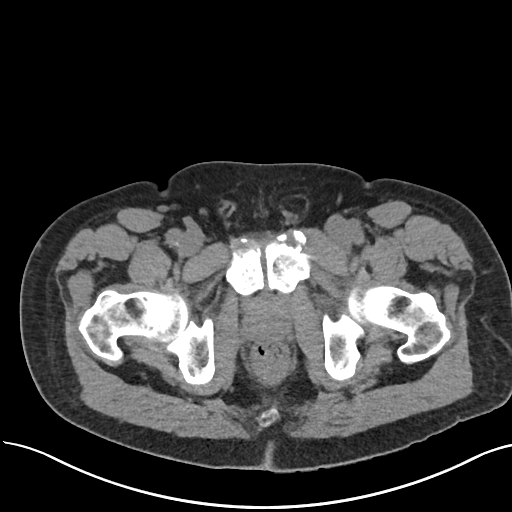
[im 25/94  soft-tissue]
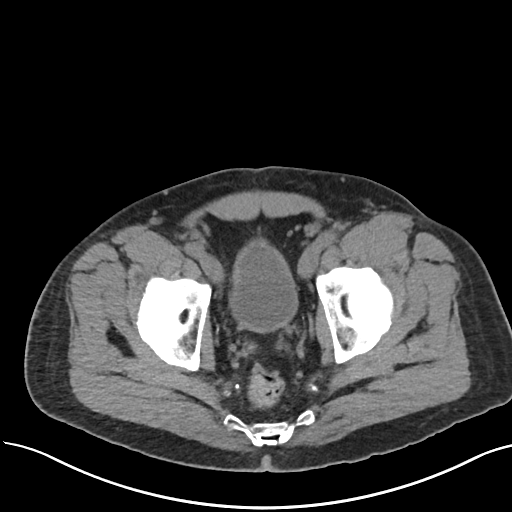
[im 33/94  soft-tissue]
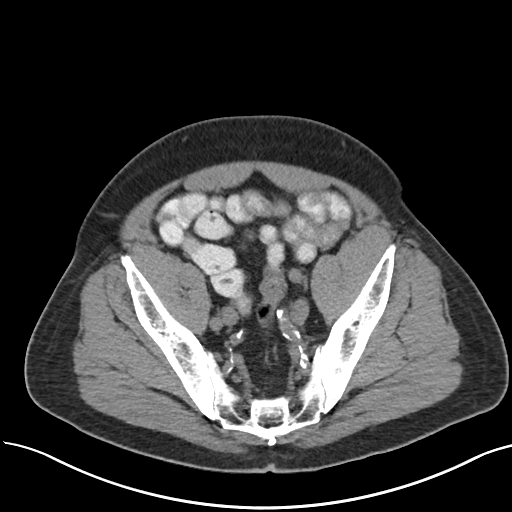
[im 37/94  soft-tissue]
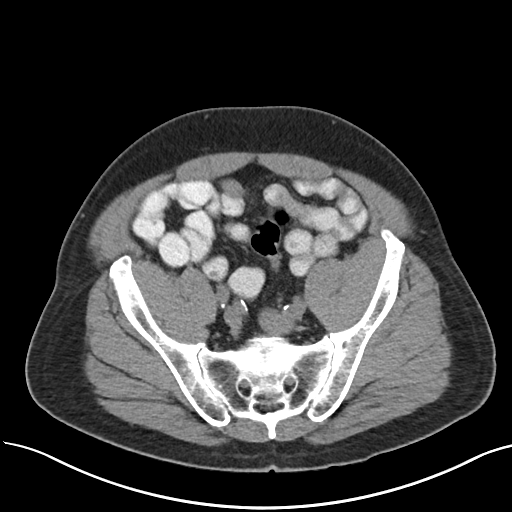
[im 45/94  soft-tissue]
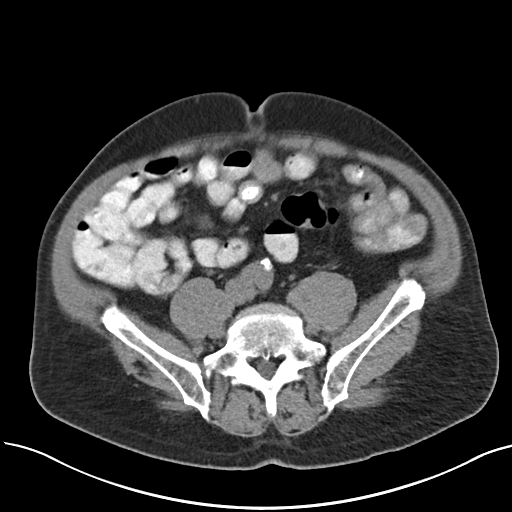
[im 49/94  soft-tissue]
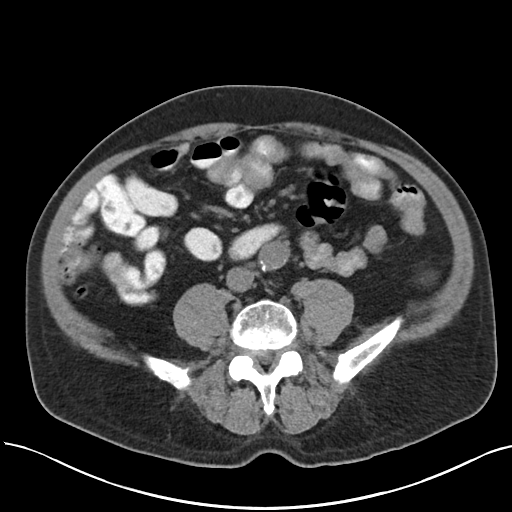
[im 57/94  soft-tissue]
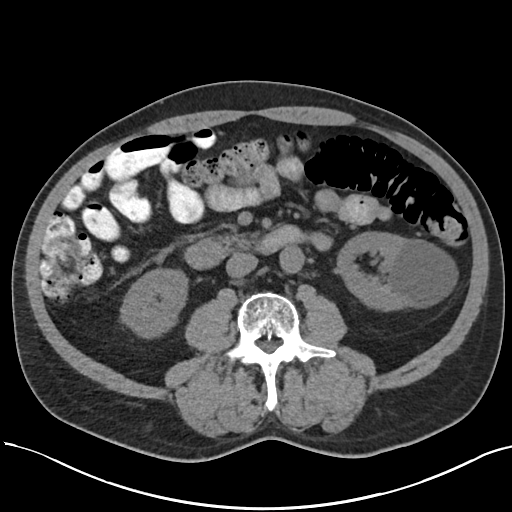
[im 57/94  bone]
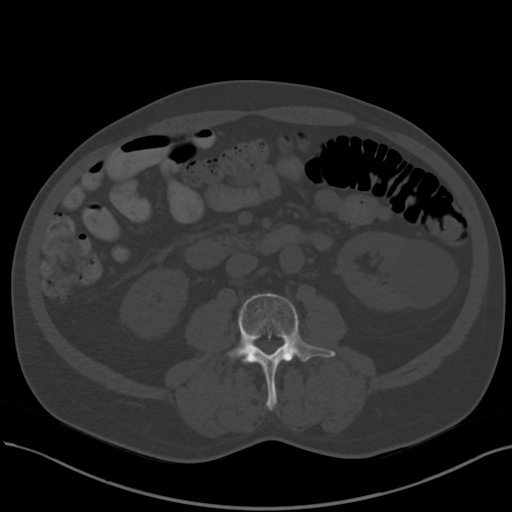
[im 61/94  soft-tissue]
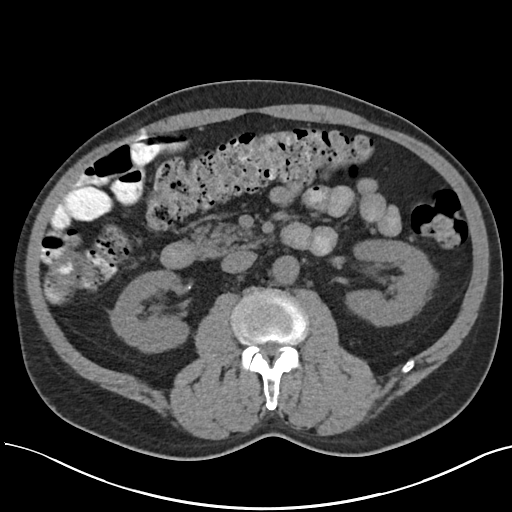
[im 69/94  soft-tissue]
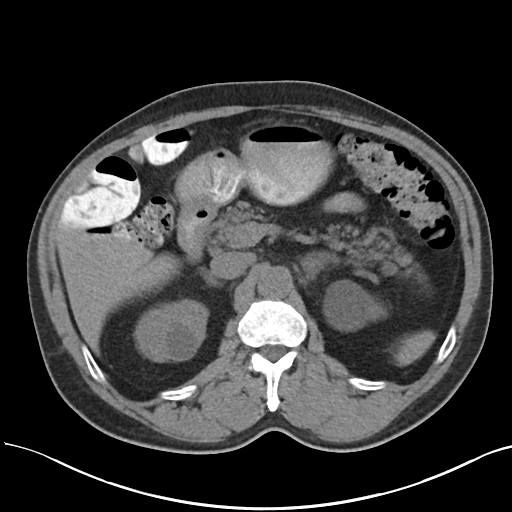
[im 77/94  soft-tissue]
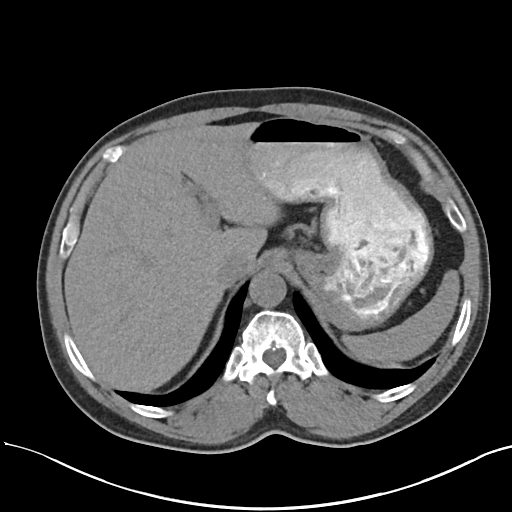
[im 81/94  soft-tissue]
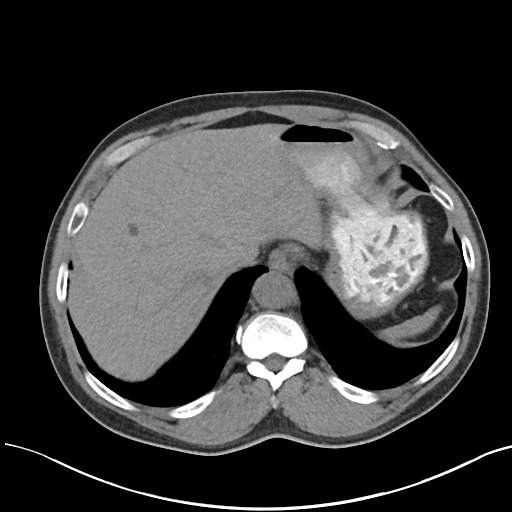
[im 89/94  soft-tissue]
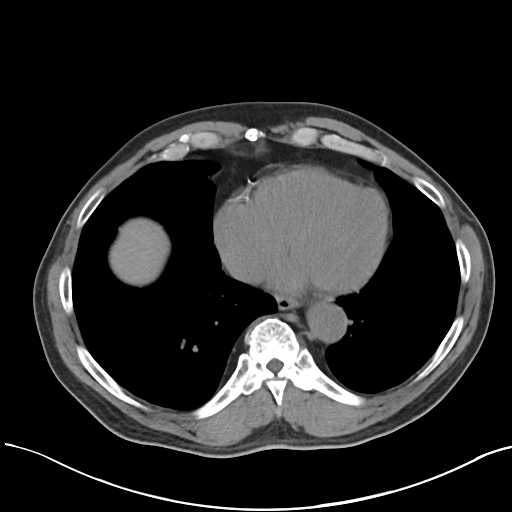

[Series 4: routine abdomen pelvis without 2.00 br40 s3 cor · coronal · non-contrast · 0.78mm/px · 3 of 199 slices shown]
[im 67/199  soft-tissue]
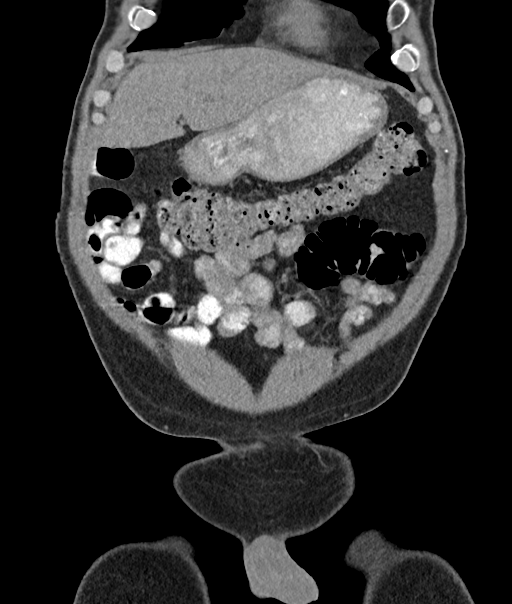
[im 89/199  soft-tissue]
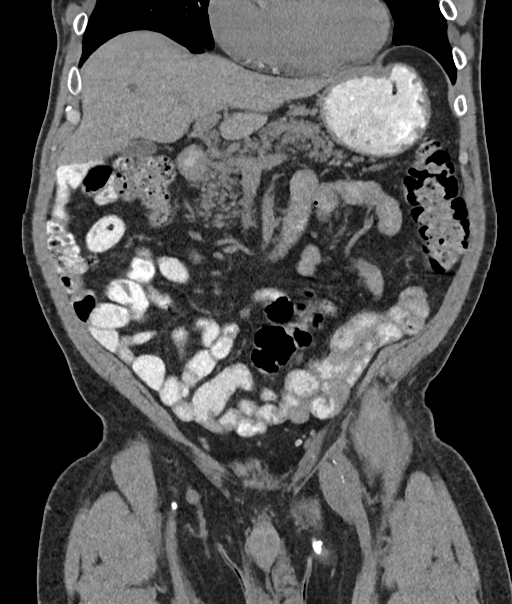
[im 111/199  soft-tissue]
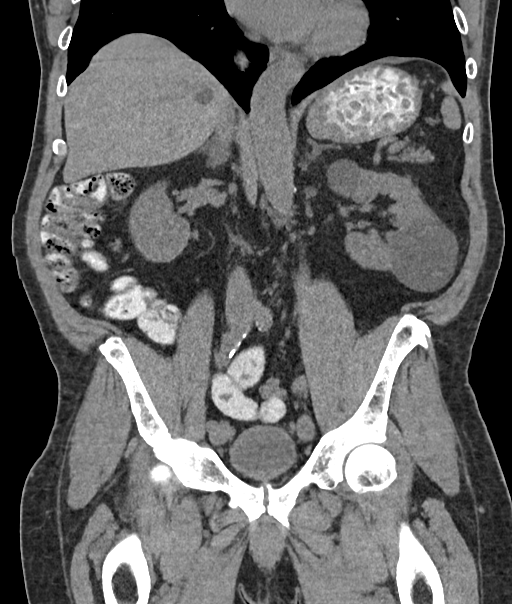

[17 of 46 positions shown; findings below may reference images not displayed]

FINDINGS: Lower chest: No acute findings.

Hepatobiliary: Several small fluid attenuation hepatic cysts are
seen. No mass visualized on this unenhanced exam. Gallbladder is
unremarkable. No evidence of biliary ductal dilatation.

Pancreas: No mass or inflammatory process visualized on this
unenhanced exam.

Spleen:  Within normal limits in size.

Adrenals/Urinary tract: Several fluid attenuation renal cysts are
noted, largest in left kidney measuring 5.5 cm. No evidence of
urolithiasis or hydronephrosis. Unremarkable unopacified urinary
bladder.

Stomach/Bowel: No evidence of obstruction, inflammatory process, or
abnormal fluid collections. Normal appendix visualized.

Vascular/Lymphatic: No pathologically enlarged lymph nodes
identified. No evidence of abdominal aortic aneurysm.

Reproductive:  No mass or other significant abnormality.

Other:  None.

Musculoskeletal:  No suspicious bone lesions identified.
IMPRESSION: No acute findings or other significant abnormality within the
abdomen or pelvis.

## 2024-03-12 NOTE — Progress Notes (Incomplete)
 "   Dementia of unclear etiology   [name] [name] is a very pleasant 79 y.o. RH male with a history of ypertension, hyperlipidemia, DM2, glaucoma ( blind) seen today in follow up for memory loss. Patient is currently on episil 10 mg daily***.   Patient was last seen on 09/11/2023 with a MoCA blind of 8/22***. Memory is ***. MMSE today is  /30. Patient is able to participate on ADLs and continues to drive without difficulties and to work at [employer]. Mood is*** . This patient is accompanied in the office by his daughter*** who supplements the history.  Previous records as well as any outside records available were reviewed prior to todays visit.   Follow up in  months Continue donepezil  10 mg daily Recommend good control of cardiovascular risk factors.   Continue to control mood as per PCP   Discussed the use of AI scribe software for clinical note transcription with the patient, who gave verbal consent to proceed.  History of Present Illness   Initial visit 09/11/2023 How long did patient have memory difficulties?  For about 1 years, slowly progressing, but daughter noticed changes for the last 2 months.  Patient reports some difficulty remembering new information, recent conversations, names.  Daughter is concerned that if his memory continues to decline, he will need higher level of care. repeats oneself?  Endorsed by  his daughter Disoriented when walking into a room? Denies    Leaving objects in unusual places?  Denies.   Wandering behavior? Denies.   Any personality changes, or depression, anxiety?  For the last year he is easily irritable and angry which is not typical for him.  Hallucinations or paranoia? Denies.   Seizures? Denies.    Any sleep changes?  Sleeps well. Denies  frequent nightmares or dream reenactment, other REM behavior or sleepwalking   Sleep apnea? Denies.   Any hygiene concerns?  Denies.   Independent of bathing and dressing? Endorsed  Does the  patient need help with medications? Daughter is in charge as he was missing doses   Who is in charge of the finances?Daughter  is in charge     Any changes in appetite?   Denies.     Patient have trouble swallowing?  Denies.   Does the patient cook? No.  Any headaches?  Denies.   Chronic pain? Denies.   Ambulates with difficulty? Denies.  Needs a cane  Recent falls or head injuries? Denies.     Vision changes?  Denies any new issues.  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  Any strokelike symptoms? Denies.   Any tremors? Denies.   Any anosmia? Denies.   Any incontinence of urine?  Endorsed, over the last 6 months attributed to diuretics. Any bowel dysfunction? Denies.      Patient lives alone.   History of heavy alcohol intake? Denies.   History of heavy tobacco use? Denies.   Family history of dementia? Denies   He works at [employer]  MRI of the brain 09/10/2023, personally reviewed, without acute intracranial abnormality.  There is mild chronic small vessel ischemic change within the cerebral white matter and pons, mild to moderate generalized cerebral atrophy and mild hippocampal atrophy  No data to display            09/11/2023    8:00 AM  Montreal Cognitive Assessment   Attention: Read list of digits (0/2) 1  Attention: Read list of letters (0/1) 1  Attention: Serial 7 subtraction starting at 100 (0/3) 2  Language: Repeat phrase (0/2) 0  Language : Fluency (0/1) 1  Abstraction (0/2) 0  Delayed Recall (0/5) 0  Orientation (0/6) 2      Objective:    Neurological Exam:    VITALS:  There were no vitals filed for this visit.  GEN:  The patient appears stated age and is in NAD. HEENT:  Normocephalic, atraumatic.   Neurological examination:  General: NAD, well-groomed, appears stated age. Orientation: The patient is alert. Oriented to person, place and not to date Cranial nerves:  There is good facial symmetry.The speech is fluent and clear.  Several missing teeth.  No aphasia or dysarthria. Fund of knowledge is appropriate. Recent and remote memory are impaired. Attention and concentration are reduced.  Patient is blind.  Unable to repeat phrases.  Hearing is intact to conversational tone. *** Sensation: Sensation is intact to light touch throughout Motor: Strength is at least antigravity x4. DTR's 2/4 in UE/LE     Movement examination:  Tone: There is normal tone in the UE/LE Abnormal movements:  no tremor.  No myoclonus.  No asterixis.   Coordination:  There is no decremation with RAM's. Normal finger to nose  Gait and Station: The patient has no*** difficulty arising out of a deep-seated chair without the use of the hands. The patient's stride length is good.  Gait is cautious and narrow.    Thank you for allowing us  the opportunity to participate in the care of this nice patient. Please do not hesitate to contact us  for any questions or concerns.   Total time spent on today's visit was *** minutes dedicated to this patient today, preparing to see patient, examining the patient, ordering tests and/or medications and counseling the patient, documenting clinical information in the EHR or other health record, independently interpreting results and communicating results to the patient/family, discussing treatment and goals, answering patient's questions and coordinating care.  Cc:  Seabron Lenis, MD  Camie Sevin 03/12/2024 5:24 AM      "

## 2024-03-13 ENCOUNTER — Ambulatory Visit: Admitting: Physician Assistant

## 2024-03-13 ENCOUNTER — Encounter: Payer: Self-pay | Admitting: Physician Assistant
